# Patient Record
Sex: Male | Born: 2000 | Race: Black or African American | Hispanic: No | Marital: Single | Smoking: Never smoker
Health system: Southern US, Community
[De-identification: ages and names within clinical notes are randomized; demographics above are authoritative.]

## PROBLEM LIST (undated history)

## (undated) DIAGNOSIS — J45909 Unspecified asthma, uncomplicated: Secondary | ICD-10-CM

---
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
## 2019-07-15 DIAGNOSIS — Z419 Encounter for procedure for purposes other than remedying health state, unspecified: Secondary | ICD-10-CM | POA: Diagnosis not present

## 2019-08-15 DIAGNOSIS — Z419 Encounter for procedure for purposes other than remedying health state, unspecified: Secondary | ICD-10-CM | POA: Diagnosis not present

## 2019-09-15 DIAGNOSIS — Z419 Encounter for procedure for purposes other than remedying health state, unspecified: Secondary | ICD-10-CM | POA: Diagnosis not present

## 2019-10-15 DIAGNOSIS — Z419 Encounter for procedure for purposes other than remedying health state, unspecified: Secondary | ICD-10-CM | POA: Diagnosis not present

## 2019-11-15 DIAGNOSIS — Z419 Encounter for procedure for purposes other than remedying health state, unspecified: Secondary | ICD-10-CM | POA: Diagnosis not present

## 2019-12-15 DIAGNOSIS — Z419 Encounter for procedure for purposes other than remedying health state, unspecified: Secondary | ICD-10-CM | POA: Diagnosis not present

## 2020-01-15 DIAGNOSIS — Z419 Encounter for procedure for purposes other than remedying health state, unspecified: Secondary | ICD-10-CM | POA: Diagnosis not present

## 2020-02-15 DIAGNOSIS — Z419 Encounter for procedure for purposes other than remedying health state, unspecified: Secondary | ICD-10-CM | POA: Diagnosis not present

## 2020-03-14 DIAGNOSIS — Z419 Encounter for procedure for purposes other than remedying health state, unspecified: Secondary | ICD-10-CM | POA: Diagnosis not present

## 2020-04-14 DIAGNOSIS — Z419 Encounter for procedure for purposes other than remedying health state, unspecified: Secondary | ICD-10-CM | POA: Diagnosis not present

## 2020-05-14 DIAGNOSIS — Z419 Encounter for procedure for purposes other than remedying health state, unspecified: Secondary | ICD-10-CM | POA: Diagnosis not present

## 2020-06-14 DIAGNOSIS — Z419 Encounter for procedure for purposes other than remedying health state, unspecified: Secondary | ICD-10-CM | POA: Diagnosis not present

## 2020-07-14 DIAGNOSIS — Z419 Encounter for procedure for purposes other than remedying health state, unspecified: Secondary | ICD-10-CM | POA: Diagnosis not present

## 2020-08-14 DIAGNOSIS — Z419 Encounter for procedure for purposes other than remedying health state, unspecified: Secondary | ICD-10-CM | POA: Diagnosis not present

## 2020-09-14 DIAGNOSIS — Z419 Encounter for procedure for purposes other than remedying health state, unspecified: Secondary | ICD-10-CM | POA: Diagnosis not present

## 2020-10-14 DIAGNOSIS — Z419 Encounter for procedure for purposes other than remedying health state, unspecified: Secondary | ICD-10-CM | POA: Diagnosis not present

## 2020-11-14 DIAGNOSIS — Z419 Encounter for procedure for purposes other than remedying health state, unspecified: Secondary | ICD-10-CM | POA: Diagnosis not present

## 2020-12-14 DIAGNOSIS — Z419 Encounter for procedure for purposes other than remedying health state, unspecified: Secondary | ICD-10-CM | POA: Diagnosis not present

## 2021-01-14 DIAGNOSIS — Z419 Encounter for procedure for purposes other than remedying health state, unspecified: Secondary | ICD-10-CM | POA: Diagnosis not present

## 2021-02-14 DIAGNOSIS — Z419 Encounter for procedure for purposes other than remedying health state, unspecified: Secondary | ICD-10-CM | POA: Diagnosis not present

## 2021-03-14 DIAGNOSIS — Z419 Encounter for procedure for purposes other than remedying health state, unspecified: Secondary | ICD-10-CM | POA: Diagnosis not present

## 2021-04-14 DIAGNOSIS — Z419 Encounter for procedure for purposes other than remedying health state, unspecified: Secondary | ICD-10-CM | POA: Diagnosis not present

## 2021-05-14 DIAGNOSIS — Z419 Encounter for procedure for purposes other than remedying health state, unspecified: Secondary | ICD-10-CM | POA: Diagnosis not present

## 2021-06-14 DIAGNOSIS — Z419 Encounter for procedure for purposes other than remedying health state, unspecified: Secondary | ICD-10-CM | POA: Diagnosis not present

## 2021-07-14 DIAGNOSIS — Z419 Encounter for procedure for purposes other than remedying health state, unspecified: Secondary | ICD-10-CM | POA: Diagnosis not present

## 2021-07-25 ENCOUNTER — Telehealth: Payer: Self-pay

## 2021-07-25 NOTE — Telephone Encounter (Signed)
Attempted to call pt to assist making appt with PCP, unable to LM.

## 2021-08-14 DIAGNOSIS — Z419 Encounter for procedure for purposes other than remedying health state, unspecified: Secondary | ICD-10-CM | POA: Diagnosis not present

## 2021-09-14 DIAGNOSIS — Z419 Encounter for procedure for purposes other than remedying health state, unspecified: Secondary | ICD-10-CM | POA: Diagnosis not present

## 2021-10-14 DIAGNOSIS — Z419 Encounter for procedure for purposes other than remedying health state, unspecified: Secondary | ICD-10-CM | POA: Diagnosis not present

## 2021-11-14 DIAGNOSIS — Z419 Encounter for procedure for purposes other than remedying health state, unspecified: Secondary | ICD-10-CM | POA: Diagnosis not present

## 2021-12-14 DIAGNOSIS — Z419 Encounter for procedure for purposes other than remedying health state, unspecified: Secondary | ICD-10-CM | POA: Diagnosis not present

## 2022-01-14 DIAGNOSIS — Z419 Encounter for procedure for purposes other than remedying health state, unspecified: Secondary | ICD-10-CM | POA: Diagnosis not present

## 2022-02-14 DIAGNOSIS — Z419 Encounter for procedure for purposes other than remedying health state, unspecified: Secondary | ICD-10-CM | POA: Diagnosis not present

## 2022-12-03 ENCOUNTER — Other Ambulatory Visit: Payer: Self-pay

## 2022-12-03 ENCOUNTER — Emergency Department: Payer: Medicaid Other

## 2022-12-03 ENCOUNTER — Inpatient Hospital Stay
Admission: EM | Admit: 2022-12-03 | Discharge: 2022-12-10 | DRG: 871 | Disposition: A | Discharge status: Home / Self Care | Payer: Medicaid Other | Attending: Student | Admitting: Student

## 2022-12-03 ENCOUNTER — Encounter: Payer: Self-pay | Admitting: Internal Medicine

## 2022-12-03 DIAGNOSIS — R197 Diarrhea, unspecified: Secondary | ICD-10-CM | POA: Diagnosis present

## 2022-12-03 DIAGNOSIS — E1165 Type 2 diabetes mellitus with hyperglycemia: Secondary | ICD-10-CM | POA: Diagnosis present

## 2022-12-03 DIAGNOSIS — N179 Acute kidney failure, unspecified: Secondary | ICD-10-CM | POA: Diagnosis present

## 2022-12-03 DIAGNOSIS — A419 Sepsis, unspecified organism: Secondary | ICD-10-CM | POA: Diagnosis present

## 2022-12-03 DIAGNOSIS — E0800 Diabetes mellitus due to underlying condition with hyperosmolarity without nonketotic hyperglycemic-hyperosmolar coma (NKHHC): Secondary | ICD-10-CM

## 2022-12-03 DIAGNOSIS — Z79899 Other long term (current) drug therapy: Secondary | ICD-10-CM

## 2022-12-03 DIAGNOSIS — Z7984 Long term (current) use of oral hypoglycemic drugs: Secondary | ICD-10-CM | POA: Diagnosis not present

## 2022-12-03 DIAGNOSIS — R7989 Other specified abnormal findings of blood chemistry: Secondary | ICD-10-CM | POA: Diagnosis present

## 2022-12-03 DIAGNOSIS — N200 Calculus of kidney: Secondary | ICD-10-CM | POA: Diagnosis present

## 2022-12-03 DIAGNOSIS — D509 Iron deficiency anemia, unspecified: Secondary | ICD-10-CM | POA: Diagnosis present

## 2022-12-03 DIAGNOSIS — Z6841 Body Mass Index (BMI) 40.0 and over, adult: Secondary | ICD-10-CM

## 2022-12-03 DIAGNOSIS — E876 Hypokalemia: Secondary | ICD-10-CM | POA: Diagnosis present

## 2022-12-03 DIAGNOSIS — N390 Urinary tract infection, site not specified: Secondary | ICD-10-CM

## 2022-12-03 DIAGNOSIS — N10 Acute tubulo-interstitial nephritis: Secondary | ICD-10-CM | POA: Diagnosis present

## 2022-12-03 DIAGNOSIS — R946 Abnormal results of thyroid function studies: Secondary | ICD-10-CM | POA: Diagnosis present

## 2022-12-03 DIAGNOSIS — Z833 Family history of diabetes mellitus: Secondary | ICD-10-CM

## 2022-12-03 DIAGNOSIS — E662 Morbid (severe) obesity with alveolar hypoventilation: Secondary | ICD-10-CM | POA: Diagnosis present

## 2022-12-03 DIAGNOSIS — R9431 Abnormal electrocardiogram [ECG] [EKG]: Secondary | ICD-10-CM | POA: Diagnosis not present

## 2022-12-03 DIAGNOSIS — E559 Vitamin D deficiency, unspecified: Secondary | ICD-10-CM | POA: Diagnosis present

## 2022-12-03 DIAGNOSIS — J9601 Acute respiratory failure with hypoxia: Secondary | ICD-10-CM | POA: Diagnosis present

## 2022-12-03 DIAGNOSIS — I10 Essential (primary) hypertension: Secondary | ICD-10-CM | POA: Diagnosis present

## 2022-12-03 DIAGNOSIS — R6520 Severe sepsis without septic shock: Secondary | ICD-10-CM | POA: Diagnosis present

## 2022-12-03 DIAGNOSIS — Z1152 Encounter for screening for COVID-19: Secondary | ICD-10-CM

## 2022-12-03 DIAGNOSIS — J45909 Unspecified asthma, uncomplicated: Secondary | ICD-10-CM | POA: Diagnosis present

## 2022-12-03 HISTORY — DX: Morbid (severe) obesity due to excess calories: E66.01

## 2022-12-03 HISTORY — DX: Unspecified asthma, uncomplicated: J45.909

## 2022-12-03 LAB — URINALYSIS, W/ REFLEX TO CULTURE (INFECTION SUSPECTED)
Bilirubin Urine: NEGATIVE
Glucose, UA: NEGATIVE mg/dL
Ketones, ur: NEGATIVE mg/dL
Nitrite: POSITIVE — AB
Protein, ur: 100 mg/dL — AB
Specific Gravity, Urine: 1.017 (ref 1.005–1.030)
WBC, UA: >50 WBC/hpf (ref 0–5)
pH: 5 (ref 5.0–8.0)

## 2022-12-03 LAB — CBC WITH DIFFERENTIAL/PLATELET
Abs Immature Granulocytes: 0.47 10*3/uL — ABNORMAL HIGH (ref 0.00–0.07)
Basophils Absolute: 0.1 10*3/uL (ref 0.0–0.1)
Basophils Relative: 0 %
Eosinophils Absolute: 0.1 10*3/uL (ref 0.0–0.5)
Eosinophils Relative: 0 %
HCT: 37.6 % — ABNORMAL LOW (ref 39.0–52.0)
Hemoglobin: 13.3 g/dL (ref 13.0–17.0)
Immature Granulocytes: 2 %
Lymphocytes Relative: 7 %
Lymphs Abs: 2.1 10*3/uL (ref 0.7–4.0)
MCH: 27.1 pg (ref 26.0–34.0)
MCHC: 35.4 g/dL (ref 30.0–36.0)
MCV: 76.7 fL — ABNORMAL LOW (ref 80.0–100.0)
Monocytes Absolute: 1.8 10*3/uL — ABNORMAL HIGH (ref 0.1–1.0)
Monocytes Relative: 6 %
Neutro Abs: 24.7 10*3/uL — ABNORMAL HIGH (ref 1.7–7.7)
Neutrophils Relative %: 85 %
Platelets: 239 10*3/uL (ref 150–400)
RBC: 4.9 MIL/uL (ref 4.22–5.81)
RDW: 12.6 % (ref 11.5–15.5)
Smear Review: NORMAL
WBC: 29.2 10*3/uL — ABNORMAL HIGH (ref 4.0–10.5)
nRBC: 0 % (ref 0.0–0.2)

## 2022-12-03 LAB — APTT: aPTT: 33 s (ref 24–36)

## 2022-12-03 LAB — COMPREHENSIVE METABOLIC PANEL
ALT: 55 U/L — ABNORMAL HIGH (ref 0–44)
AST: 25 U/L (ref 15–41)
Albumin: 3.3 g/dL — ABNORMAL LOW (ref 3.5–5.0)
Alkaline Phosphatase: 86 U/L (ref 38–126)
Anion gap: 11 (ref 5–15)
BUN: 12 mg/dL (ref 6–20)
CO2: 21 mmol/L — ABNORMAL LOW (ref 22–32)
Calcium: 8.4 mg/dL — ABNORMAL LOW (ref 8.9–10.3)
Chloride: 102 mmol/L (ref 98–111)
Creatinine, Ser: 1.14 mg/dL (ref 0.61–1.24)
GFR, Estimated: >60 mL/min (ref >60)
Glucose, Bld: 219 mg/dL — ABNORMAL HIGH (ref 70–99)
Potassium: 3.3 mmol/L — ABNORMAL LOW (ref 3.5–5.1)
Sodium: 134 mmol/L — ABNORMAL LOW (ref 135–145)
Total Bilirubin: 2.3 mg/dL — ABNORMAL HIGH (ref <1.2)
Total Protein: 7.3 g/dL (ref 6.5–8.1)

## 2022-12-03 LAB — PROTIME-INR
INR: 1.4 — ABNORMAL HIGH (ref 0.8–1.2)
Prothrombin Time: 17.2 s — ABNORMAL HIGH (ref 11.4–15.2)

## 2022-12-03 LAB — LACTIC ACID, PLASMA
Lactic Acid, Venous: 1.8 mmol/L (ref 0.5–1.9)
Lactic Acid, Venous: 1.6 mmol/L (ref 0.5–1.9)

## 2022-12-03 LAB — RESP PANEL BY RT-PCR (RSV, FLU A&B, COVID)  RVPGX2
Influenza A by PCR: NEGATIVE
Influenza B by PCR: NEGATIVE
Resp Syncytial Virus by PCR: NEGATIVE
SARS Coronavirus 2 by RT PCR: NEGATIVE

## 2022-12-03 LAB — PHOSPHORUS: Phosphorus: 2.2 mg/dL — ABNORMAL LOW (ref 2.5–4.6)

## 2022-12-03 LAB — MAGNESIUM: Magnesium: 1.4 mg/dL — ABNORMAL LOW (ref 1.7–2.4)

## 2022-12-03 MED ORDER — HEPARIN SODIUM (PORCINE) 5000 UNIT/ML IJ SOLN
5000.0000 [IU] | Freq: Three times a day (TID) | INTRAMUSCULAR | Status: DC
  Administered 2022-12-03 – 2022-12-06 (×8): 5000 [IU] via SUBCUTANEOUS
  Filled 2022-12-03 (×8): qty 1

## 2022-12-03 MED ORDER — DM-GUAIFENESIN ER 30-600 MG PO TB12: 1.0000 | ORAL_TABLET | Freq: Two times a day (BID) | ORAL | Status: DC | PRN

## 2022-12-03 MED ORDER — LACTATED RINGERS IV SOLN: INTRAVENOUS | Status: AC

## 2022-12-03 MED ORDER — POTASSIUM CHLORIDE CRYS ER 20 MEQ PO TBCR
40.0000 meq | EXTENDED_RELEASE_TABLET | Freq: Once | ORAL | Status: AC
  Administered 2022-12-03: 40 meq via ORAL
  Filled 2022-12-03: qty 2

## 2022-12-03 MED ORDER — ALBUTEROL SULFATE (2.5 MG/3ML) 0.083% IN NEBU: 3.0000 mL | INHALATION_SOLUTION | RESPIRATORY_TRACT | Status: DC | PRN

## 2022-12-03 MED ORDER — LACTATED RINGERS IV BOLUS
1000.0000 mL | Freq: Once | INTRAVENOUS | Status: AC
  Administered 2022-12-03: 1000 mL via INTRAVENOUS

## 2022-12-03 MED ORDER — SODIUM CHLORIDE 0.9 % IV SOLN
2.0000 g | Freq: Once | INTRAVENOUS | Status: AC
  Administered 2022-12-03: 2 g via INTRAVENOUS
  Filled 2022-12-03: qty 12.5

## 2022-12-03 MED ORDER — IPRATROPIUM BROMIDE 0.02 % IN SOLN
2.5000 mL | Freq: Four times a day (QID) | RESPIRATORY_TRACT | Status: DC
  Administered 2022-12-03 – 2022-12-04 (×5): 0.5 mg via RESPIRATORY_TRACT
  Filled 2022-12-03 (×5): qty 2.5

## 2022-12-03 MED ORDER — ACETAMINOPHEN 500 MG PO TABS
1000.0000 mg | ORAL_TABLET | Freq: Once | ORAL | Status: AC
Start: 2022-12-03 — End: 2022-12-03
  Administered 2022-12-03: 1000 mg via ORAL
  Filled 2022-12-03: qty 2

## 2022-12-03 MED ORDER — IBUPROFEN 800 MG PO TABS
800.0000 mg | ORAL_TABLET | Freq: Once | ORAL | Status: AC
  Administered 2022-12-03: 800 mg via ORAL
  Filled 2022-12-03: qty 1

## 2022-12-03 MED ORDER — IBUPROFEN 400 MG PO TABS
600.0000 mg | ORAL_TABLET | ORAL | Status: DC | PRN
  Administered 2022-12-04: 600 mg via ORAL
  Filled 2022-12-03: qty 2

## 2022-12-03 MED ORDER — ONDANSETRON HCL 4 MG/2ML IJ SOLN
4.0000 mg | Freq: Three times a day (TID) | INTRAMUSCULAR | Status: DC | PRN
  Administered 2022-12-04 – 2022-12-05 (×2): 4 mg via INTRAVENOUS
  Filled 2022-12-03 (×2): qty 2

## 2022-12-03 MED ORDER — SODIUM CHLORIDE 0.9 % IV SOLN
2.0000 g | INTRAVENOUS | Status: DC
  Administered 2022-12-03 – 2022-12-05 (×3): 2 g via INTRAVENOUS
  Filled 2022-12-03 (×4): qty 20

## 2022-12-03 MED ORDER — VANCOMYCIN HCL IN DEXTROSE 1-5 GM/200ML-% IV SOLN
1000.0000 mg | Freq: Once | INTRAVENOUS | Status: AC
Start: 2022-12-03 — End: 2022-12-03
  Administered 2022-12-03: 1000 mg via INTRAVENOUS
  Filled 2022-12-03: qty 200

## 2022-12-03 NOTE — ED Notes (Signed)
Pt given urinal at this time 

## 2022-12-03 NOTE — H&P (Addendum)
History and Physical    Lawrence Knight PIR:[id number] DOB: 29-Nov-2000 DOA: 12/03/2022  Referring MD/NP/PA:   PCP: Pcp, No   Patient coming from:  The patient is coming from home.     Chief Complaint: Dysuria, increased urinary frequency, right flank pain, fever, chills  HPI: Lawrence Knight is a 22 y.o. male with medical history significant of morbid obesity with BMI 71, asthma, who presents with dysuria, increased urinary frequency, right flank pain, fever and chills.  Patient states that his symptoms started yesterday, including right flank pain, dysuria, increased urinary frequency, fever and chills.  No burning on urination.  Patient has dry cough, denies SOB or chest pain.  He has nausea, no vomiting, diarrhea or abdominal pain.  Per report, patient had wheezing initially, which has resolved.  When I saw patient in ED, patient does not have wheezing on my examination.  Data reviewed independently and ED Course: pt was found to have WBC 29.2, potassium 3.3, GFR> 60, positive urinalysis (cloudy appearance, moderate amount leukocyte, positive nitrite, few bacteria, WBC> 50), negative PCR for COVID, flu and RSV, abnormal liver function (ALP 86, AST 25, ALT 55, total bilirubin 2.3), lactic acid 1.8.  Temperature while 3.1, blood pressure 175/95, 131/98, heart rate 150 --> 120-130s, RR 29, oxygen saturation 100% on room air.  Chest x-ray showed bronchitis change.  Patient is admitted to PCU as inpatient.   EKG: I have personally reviewed.  Sinus rhythm, QTc 415, mild T wave inversion in inferior leads and V4-V6.  CT-renal stone  Mild perinephric stranding around right kidney without hydronephrosis. This could be related to recently passed stone or pyelonephritis. Recommend clinical correlation.   Review of Systems:   General: has fevers, chills, no body weight gain, has fatigue HEENT: no blurry vision, hearing changes or sore throat Respiratory: no dyspnea, has dry coughing, no wheezing CV:  no chest pain, no palpitations GI: has nausea, no vomiting, abdominal pain, diarrhea, constipation GU: has dysuria, increased urinary frequency, no hematuria or  burning on urination, Ext: no leg edema Neuro: no unilateral weakness, numbness, or tingling, no vision change or hearing loss Skin: no rash, no skin tear. MSK: No muscle spasm, no deformity, no limitation of range of movement in spin Heme: No easy bruising.  Travel history: No recent long distant travel.   Allergy: No Known Allergies  Past Medical History:  Diagnosis Date   Asthma    Morbid obesity with BMI of 70 and over, adult [facility])     History reviewed. No pertinent surgical history.  Social History:  reports that he has never smoked. He has never used smokeless tobacco. He reports that he does not currently use alcohol. He reports that he does not use drugs.  Family History:  Family History  Problem Relation Age of Onset   Diabetes Mother      Prior to Admission medications   Not on File    Physical Exam: Vitals:   12/03/22 1830 12/03/22 1930 12/03/22 2000 12/03/22 2030  BP: (!) 175/95 (!) 131/98 136/68 (!) 143/76  Pulse: (!) 121 (!) 139 (!) 125 (!) 125  Resp: (!) 25 18 (!) 31 (!) 26  Temp: (!) 103.1 F (39.5 C)   99.5 F (37.5 C)  TempSrc:    Oral  SpO2: 98% 95% 96% 97%  Weight:      Height:       General: Not in acute distress HEENT:       Eyes: PERRL, EOMI, no jaundice  ENT: No discharge from the ears and nose, no pharynx injection, no tonsillar enlargement.        Neck: No JVD, no bruit, no mass felt. Heme: No neck lymph node enlargement. Cardiac: S1/S2, RRR, No murmurs, No gallops or rubs. Respiratory: No rales, wheezing, rhonchi or rubs. GI: Soft, nondistended, nontender, no rebound pain, no organomegaly, BS present. GU: has positive right CVA tenderness Ext: No pitting leg edema bilaterally. 1+DP/PT pulse bilaterally. Musculoskeletal: No joint deformities, No joint redness or warmth,  no limitation of ROM in spin. Skin: No rashes.  Neuro: Alert, oriented X3, cranial nerves II-XII grossly intact, moves all extremities normally. Psych: Patient is not psychotic, no suicidal or hemocidal ideation.  Labs on Admission: I have personally reviewed following labs and imaging studies  CBC: Recent Labs  Lab 12/03/22 1322  WBC 29.2*  NEUTROABS 24.7*  HGB 13.3  HCT 37.6*  MCV 76.7*  PLT 239   Basic Metabolic Panel: Recent Labs  Lab 12/03/22 1322  NA 134*  K 3.3*  CL 102  CO2 21*  GLUCOSE 219*  BUN 12  CREATININE 1.14  CALCIUM 8.4*  MG 1.4*  PHOS 2.2*   GFR: Estimated Creatinine Clearance: 201 mL/min (by C-G formula based on SCr of 1.14 mg/dL). Liver Function Tests: Recent Labs  Lab 12/03/22 1322  AST 25  ALT 55*  ALKPHOS 86  BILITOT 2.3*  PROT 7.3  ALBUMIN 3.3*   No results for input(s): "LIPASE", "AMYLASE" in the last 168 hours. No results for input(s): "AMMONIA" in the last 168 hours. Coagulation Profile: Recent Labs  Lab 12/03/22 2045  INR 1.4*   Cardiac Enzymes: No results for input(s): "CKTOTAL", "CKMB", "CKMBINDEX", "TROPONINI" in the last 168 hours. BNP (last 3 results) No results for input(s): "PROBNP" in the last 8760 hours. HbA1C: No results for input(s): "HGBA1C" in the last 72 hours. CBG: No results for input(s): "GLUCAP" in the last 168 hours. Lipid Profile: No results for input(s): "CHOL", "HDL", "LDLCALC", "TRIG", "CHOLHDL", "LDLDIRECT" in the last 72 hours. Thyroid Function Tests: No results for input(s): "TSH", "T4TOTAL", "FREET4", "T3FREE", "THYROIDAB" in the last 72 hours. Anemia Panel: No results for input(s): "VITAMINB12", "FOLATE", "FERRITIN", "TIBC", "IRON", "RETICCTPCT" in the last 72 hours. Urine analysis:    Component Value Date/Time   COLORURINE AMBER (A) 12/03/2022 1801   APPEARANCEUR CLOUDY (A) 12/03/2022 1801   LABSPEC 1.017 12/03/2022 1801   PHURINE 5.0 12/03/2022 1801   GLUCOSEU NEGATIVE 12/03/2022 1801    HGBUR MODERATE (A) 12/03/2022 1801   BILIRUBINUR NEGATIVE 12/03/2022 1801   KETONESUR NEGATIVE 12/03/2022 1801   PROTEINUR 100 (A) 12/03/2022 1801   NITRITE POSITIVE (A) 12/03/2022 1801   LEUKOCYTESUR MODERATE (A) 12/03/2022 1801   Sepsis Labs: @LABRCNTIP (procalcitonin:4,lacticidven:4) ) Recent Results (from the past 240 hour(s))  Resp panel by RT-PCR (RSV, Flu A&B, Covid) Anterior Nasal Swab     Status: None   Collection Time: 12/03/22  1:43 PM   Specimen: Anterior Nasal Swab  Result Value Ref Range Status   SARS Coronavirus 2 by RT PCR NEGATIVE NEGATIVE Final    Comment: (NOTE) SARS-CoV-2 target nucleic acids are NOT DETECTED.  The SARS-CoV-2 RNA is generally detectable in upper respiratory specimens during the acute phase of infection. The lowest concentration of SARS-CoV-2 viral copies this assay can detect is 138 copies/mL. A negative result does not preclude SARS-Cov-2 infection and should not be used as the sole basis for treatment or other patient management decisions. A negative result may occur with  improper  specimen collection/handling, submission of specimen other than nasopharyngeal swab, presence of viral mutation(s) within the areas targeted by this assay, and inadequate number of viral copies(<138 copies/mL). A negative result must be combined with clinical observations, patient history, and epidemiological information. The expected result is Negative.  Fact Sheet for Patients:  BloggerCourse.com  Fact Sheet for Healthcare Providers:  SeriousBroker.it  This test is no t yet approved or cleared by the [place] FDA and  has been authorized for detection and/or diagnosis of SARS-CoV-2 by FDA under an Emergency Use Authorization (EUA). This EUA will remain  in effect (meaning this test can be used) for the duration of the COVID-19 declaration under Section 564(b)(1) of the Act, 21 U.S.C.section  360bbb-3(b)(1), unless the authorization is terminated  or revoked sooner.       Influenza A by PCR NEGATIVE NEGATIVE Final   Influenza B by PCR NEGATIVE NEGATIVE Final    Comment: (NOTE) The Xpert Xpress SARS-CoV-2/FLU/RSV plus assay is intended as an aid in the diagnosis of influenza from Nasopharyngeal swab specimens and should not be used as a sole basis for treatment. Nasal washings and aspirates are unacceptable for Xpert Xpress SARS-CoV-2/FLU/RSV testing.  Fact Sheet for Patients: BloggerCourse.com  Fact Sheet for Healthcare Providers: SeriousBroker.it  This test is not yet approved or cleared by the [place] FDA and has been authorized for detection and/or diagnosis of SARS-CoV-2 by FDA under an Emergency Use Authorization (EUA). This EUA will remain in effect (meaning this test can be used) for the duration of the COVID-19 declaration under Section 564(b)(1) of the Act, 21 U.S.C. section 360bbb-3(b)(1), unless the authorization is terminated or revoked.     Resp Syncytial Virus by PCR NEGATIVE NEGATIVE Final    Comment: (NOTE) Fact Sheet for Patients: BloggerCourse.com  Fact Sheet for Healthcare Providers: SeriousBroker.it  This test is not yet approved or cleared by the [place] FDA and has been authorized for detection and/or diagnosis of SARS-CoV-2 by FDA under an Emergency Use Authorization (EUA). This EUA will remain in effect (meaning this test can be used) for the duration of the COVID-19 declaration under Section 564(b)(1) of the Act, 21 U.S.C. section 360bbb-3(b)(1), unless the authorization is terminated or revoked.  Performed at [facility], [address] [place], [place] [address]      Radiological Exams on Admission: CT Renal Stone Study  Result Date: 12/03/2022 CLINICAL DATA:  Right flank pain EXAM: CT ABDOMEN AND PELVIS  WITHOUT CONTRAST TECHNIQUE: Multidetector CT imaging of the abdomen and pelvis was performed following the standard protocol without IV contrast. RADIATION DOSE REDUCTION: This exam was performed according to the departmental dose-optimization program which includes automated exposure control, adjustment of the mA and/or kV according to patient size and/or use of iterative reconstruction technique. COMPARISON:  None Available. FINDINGS: Lower chest: No acute abnormality Hepatobiliary: Diffuse low-density throughout the liver compatible with fatty infiltration. No focal abnormality. Gallbladder not visualized and could be contracted or absent. Pancreas: No focal abnormality or ductal dilatation. Spleen: No focal abnormality.  Normal size. Adrenals/Urinary Tract: Adrenal glands normal. Perinephric stranding around the right kidney without visible stones or hydronephrosis. Left kidney unremarkable. Urinary bladder unremarkable. Stomach/Bowel: Stomach, large and small bowel grossly unremarkable. Appendix not definitively seen. Vascular/Lymphatic: No evidence of aneurysm or adenopathy. Reproductive: No visible focal abnormality. Other: No free fluid or free air. Musculoskeletal: No acute bony abnormality. IMPRESSION: Mild perinephric stranding around right kidney without hydronephrosis. This could be related to recently passed stone or pyelonephritis.  Recommend clinical correlation. Fatty liver. Electronically Signed   By: Charlett Nose M.D.   On: 12/03/2022 19:45   DG Chest Port 1 View  Result Date: 12/03/2022 CLINICAL DATA:  Questionable sepsis - evaluate for abnormality EXAM: PORTABLE CHEST 1 VIEW COMPARISON:  None Available. FINDINGS: Heart size upper normal. Mediastinal contours are normal. Suspected bronchial thickening without focal airspace disease. No large pleural effusion. No pneumothorax. Soft tissue attenuation from habitus limits assessment. IMPRESSION: Suspected bronchial thickening without focal  airspace disease. Electronically Signed   By: Narda Rutherford M.D.   On: 12/03/2022 17:45      Assessment/Plan Principal Problem:   Acute pyelonephritis Active Problems:   Sepsis (HCC)   Abnormal LFTs   Morbid obesity with BMI of 70 and over, adult (HCC)   Asthma   Assessment and Plan:  Sepsis due to acute pyelonephritis: Patient has sepsis with WBC 29.2, heart rate up to 150, RR up to 25-30s, temperature 102.3 0.1.  Lactic acid is normal, 1.8 --> 1.6.  CT per renal stone protocol is negative for hydronephrosis, but showed mild perinephric stranding around right kidney, could be related to recent passage of stone or pyelonephritis.  Patient is at high risk of developing hypotension.  -will admit to PCU as inpt -IV Rocephin (patient received 1 dose of vancomycin and cefepime in ED) -Follow-up of blood culture and urine culture -IV fluid: 3 L LR, 100 cc/h -As needed ibuprofen for fever and pain -As needed Zofran for nausea  Abnormal LFTs: Mild abnormal liver function, possibly due to sepsis -Check hepatitis panel -Avoid using Tylenol  Morbid obesity with BMI of 70 and over, adult [facility]): Body weight 239 kg, BMI 71.47 -Encouraged losing weight -Exercise and healthy diet  Asthma:  -Bronchodilators and as needed Mucinex      DVT ppx: SQ Heparin    Code Status: Full code     Family Communication: not done, no family member is at bed side.       Disposition Plan:  Anticipate discharge back to previous environment  Consults called:  none  Admission status and Level of care: Progressive:    as inpt       Dispo: The patient is from: Home              Anticipated d/c is to: Home              Anticipated d/c date is: 2 days              Patient currently is not medically stable to d/c.    Severity of Illness:  The appropriate patient status for this patient is INPATIENT. Inpatient status is judged to be reasonable and necessary in order to provide the required  intensity of service to ensure the patient's safety. The patient's presenting symptoms, physical exam findings, and initial radiographic and laboratory data in the context of their chronic comorbidities is felt to place them at high risk for further clinical deterioration. Furthermore, it is not anticipated that the patient will be medically stable for discharge from the hospital within 2 midnights of admission.   * I certify that at the point of admission it is my clinical judgment that the patient will require inpatient hospital care spanning beyond 2 midnights from the point of admission due to high intensity of service, high risk for further deterioration and high frequency of surveillance required.*       Date of Service 12/03/2022    Lorretta Harp [facility]  If 7PM-7AM, please contact night-coverage [email or web address] 12/03/2022, 11:37 PM

## 2022-12-03 NOTE — ED Notes (Signed)
This RN notified MD of patient's temperature.

## 2022-12-03 NOTE — ED Notes (Signed)
Called CCMD for cardiac monitoring

## 2022-12-03 NOTE — ED Provider Notes (Signed)
[facility] Provider Note    Event Date/Time   First MD Initiated Contact with Patient 12/03/22 1322     (approximate)   History   Fever, Dysuria, and Flank Pain   HPI  Lawrence Knight is a 22 y.o. male who presents to the emergency department today because of concerns for right flank pain.  The patient states that the pain started around 10:00 yesterday evening.  He had been having a headache earlier in the day.  This morning he noticed painful urination and bad odor to his urine.  He also started developing fevers.  The patient denies similar symptoms in the past.  Denies history of urinary tract infections.     Physical Exam   Triage Vital Signs: ED Triage Vitals  Encounter Vitals Group     BP 12/03/22 1314 (!) 121/106     Systolic BP Percentile --      Diastolic BP Percentile --      Pulse Rate 12/03/22 1314 (!) 145     Resp 12/03/22 1314 20     Temp 12/03/22 1314 (!) 103.1 F (39.5 C)     Temp Source 12/03/22 1314 Oral     SpO2 12/03/22 1314 95 %     Weight 12/03/22 1315 (!) 527 lb (239 kg)     Height 12/03/22 1315 6' (1.829 m)     Head Circumference --      Peak Flow --      Pain Score 12/03/22 1315 4     Pain Loc --      Pain Education --      Exclude from Growth Chart --     Most recent vital signs: Vitals:   12/03/22 1314 12/03/22 1315  BP: (!) 121/106 (!) 152/100  Pulse: (!) 145 (!) 150  Resp: 20 (!) 21  Temp: (!) 103.1 F (39.5 C)   SpO2: 95% 99%   General: Awake, alert, oriented. CV:  Good peripheral perfusion. Tachycardia. Resp:  Normal effort. Tachypnea Abd:  No distention. Non tender.   ED Results / Procedures / Treatments   Labs (all labs ordered are listed, but only abnormal results are displayed) Labs Reviewed  COMPREHENSIVE METABOLIC PANEL - Abnormal; Notable for the following components:      Result Value   Sodium 134 (*)    Potassium 3.3 (*)    CO2 21 (*)    Glucose, Bld 219 (*)    Calcium 8.4 (*)     Albumin 3.3 (*)    ALT 55 (*)    Total Bilirubin 2.3 (*)    All other components within normal limits  CBC WITH DIFFERENTIAL/PLATELET - Abnormal; Notable for the following components:   WBC 29.2 (*)    HCT 37.6 (*)    MCV 76.7 (*)    Neutro Abs 24.7 (*)    Monocytes Absolute 1.8 (*)    Abs Immature Granulocytes 0.47 (*)    All other components within normal limits  CULTURE, BLOOD (ROUTINE X 2)  CULTURE, BLOOD (ROUTINE X 2)  RESP PANEL BY RT-PCR (RSV, FLU A&B, COVID)  [id number]  LACTIC ACID, PLASMA  LACTIC ACID, PLASMA  URINALYSIS, W/ REFLEX TO CULTURE (INFECTION SUSPECTED)     EKG  I, Phineas Semen, attending physician, personally viewed and interpreted this EKG  EKG Time: 1341 Rate: 130 Rhythm: sinus tachycardia Axis: normal Intervals: qtc 415 QRS: narrow ST changes: no st elevation Impression: abnormal ekg   RADIOLOGY I independently interpreted and visualized  the CXR. My interpretation: No pneumonia Radiology interpretation: pending at time of sign out  I independently interpreted and visualized the CT renal. My interpretation: No stones, right sided pyelonephritis. Radiology interpretation: pending at time of sign out     PROCEDURES:  Critical Care performed: Yes  CRITICAL CARE Performed by: Phineas Semen   Total critical care time: 30 minutes  Critical care time was exclusive of separately billable procedures and treating other patients.  Critical care was necessary to treat or prevent imminent or life-threatening deterioration.  Critical care was time spent personally by me on the following activities: development of treatment plan with patient and/or surrogate as well as nursing, discussions with consultants, evaluation of patient's response to treatment, examination of patient, obtaining history from patient or surrogate, ordering and performing treatments and interventions, ordering and review of laboratory studies, ordering and review of  radiographic studies, pulse oximetry and re-evaluation of patient's condition.   MEDICATIONS ORDERED IN ED: Medications - No data to display   IMPRESSION / MDM / ASSESSMENT AND PLAN / ED COURSE  I reviewed the triage vital signs and the nursing notes.                              Differential diagnosis includes, but is not limited to, UTI, pyelonephritis, kidney stone  Patient's presentation is most consistent with acute presentation with potential threat to life or bodily function.   The patient is on the cardiac monitor to evaluate for evidence of arrhythmia and/or significant heart rate changes.  Patient presented to the emergency department today because of concern for right sided flank pain. Found to have fever, tachycardia. Blood work concerning for leukocytosis. Patient was started on antibiotics and fluids. Will get CT scan to evaluate for possible stone/pyelo.  CT scan per my read is consistent with pyelonephritis. I do not see any obvious obstructing stones. Awaiting official radiology read and UA at time of sign out.      FINAL CLINICAL IMPRESSION(S) / ED DIAGNOSES   Pyelonephritis  Note:  This document was prepared using Dragon voice recognition software and may include unintentional dictation errors.    Phineas Semen, MD 12/04/22 [phone]

## 2022-12-03 NOTE — ED Triage Notes (Signed)
Pt arrives via ems from home, pt c/o right flank pain, pt having pain with urination, states chills and nausea last pm, states darker than normal with a strong odor

## 2022-12-03 NOTE — ED Notes (Signed)
EKG given to MD at this time.

## 2022-12-03 NOTE — ED Provider Notes (Signed)
-----------------------------------------   6:42 PM on 12/03/2022 ----------------------------------------- Patient care assumed from Dr. Derrill Kay.  Patient's urinalysis has resulted nitrite positive with white blood cells bacteria and white blood cell clumps likely the source of the patient's infection.  Patient is febrile once again we will dose ibuprofen.  Continue with IV antibiotics and admit to the hospital service as the patient meets sepsis criteria.  I have reviewed the CT images on my evaluation I do not see any obvious kidney stone or other significant abnormality.   Minna Antis, MD 12/03/22 [phone]

## 2022-12-04 ENCOUNTER — Inpatient Hospital Stay: Payer: Medicaid Other

## 2022-12-04 ENCOUNTER — Encounter: Payer: Self-pay | Admitting: Internal Medicine

## 2022-12-04 DIAGNOSIS — N10 Acute tubulo-interstitial nephritis: Secondary | ICD-10-CM | POA: Diagnosis not present

## 2022-12-04 LAB — BLOOD CULTURE ID PANEL (REFLEXED) - BCID2

## 2022-12-04 LAB — CBC
HCT: 31.9 % — ABNORMAL LOW (ref 39.0–52.0)
Hemoglobin: 11.4 g/dL — ABNORMAL LOW (ref 13.0–17.0)
MCH: 27.5 pg (ref 26.0–34.0)
MCHC: 35.7 g/dL (ref 30.0–36.0)
MCV: 76.9 fL — ABNORMAL LOW (ref 80.0–100.0)
Platelets: 180 10*3/uL (ref 150–400)
RBC: 4.15 MIL/uL — ABNORMAL LOW (ref 4.22–5.81)
RDW: 12.8 % (ref 11.5–15.5)
WBC: 18.6 10*3/uL — ABNORMAL HIGH (ref 4.0–10.5)
nRBC: 0 % (ref 0.0–0.2)

## 2022-12-04 LAB — LIPID PANEL
Cholesterol: 112 mg/dL (ref 0–200)
HDL: 17 mg/dL — ABNORMAL LOW (ref >40)
LDL Cholesterol: 73 mg/dL (ref 0–99)
Total CHOL/HDL Ratio: 6.6 {ratio}
Triglycerides: 109 mg/dL (ref <150)
VLDL: 22 mg/dL (ref 0–40)

## 2022-12-04 LAB — COMPREHENSIVE METABOLIC PANEL
ALT: 44 U/L (ref 0–44)
AST: 24 U/L (ref 15–41)
Albumin: 2.7 g/dL — ABNORMAL LOW (ref 3.5–5.0)
Alkaline Phosphatase: 77 U/L (ref 38–126)
Anion gap: 11 (ref 5–15)
BUN: 18 mg/dL (ref 6–20)
CO2: 20 mmol/L — ABNORMAL LOW (ref 22–32)
Calcium: 8.1 mg/dL — ABNORMAL LOW (ref 8.9–10.3)
Chloride: 105 mmol/L (ref 98–111)
Creatinine, Ser: 1.68 mg/dL — ABNORMAL HIGH (ref 0.61–1.24)
GFR, Estimated: 58 mL/min — ABNORMAL LOW (ref >60)
Glucose, Bld: 241 mg/dL — ABNORMAL HIGH (ref 70–99)
Potassium: 3.4 mmol/L — ABNORMAL LOW (ref 3.5–5.1)
Sodium: 136 mmol/L (ref 135–145)
Total Bilirubin: 2.1 mg/dL — ABNORMAL HIGH (ref <1.2)
Total Protein: 6.3 g/dL — ABNORMAL LOW (ref 6.5–8.1)

## 2022-12-04 LAB — GLUCOSE, CAPILLARY
Glucose-Capillary: 207 mg/dL — ABNORMAL HIGH (ref 70–99)
Glucose-Capillary: 234 mg/dL — ABNORMAL HIGH (ref 70–99)

## 2022-12-04 LAB — VITAMIN B12: Vitamin B-12: 218 pg/mL (ref 180–914)

## 2022-12-04 LAB — CBG MONITORING, ED
Glucose-Capillary: 213 mg/dL — ABNORMAL HIGH (ref 70–99)
Glucose-Capillary: 205 mg/dL — ABNORMAL HIGH (ref 70–99)

## 2022-12-04 LAB — IRON AND TIBC
Iron: 16 ug/dL — ABNORMAL LOW (ref 45–182)
Saturation Ratios: 8 % — ABNORMAL LOW (ref 17.9–39.5)
TIBC: 209 ug/dL — ABNORMAL LOW (ref 250–450)
UIBC: 193 ug/dL

## 2022-12-04 LAB — D-DIMER, QUANTITATIVE: D-Dimer, Quant: 3.61 ug{FEU}/mL — ABNORMAL HIGH (ref 0.00–0.50)

## 2022-12-04 LAB — HEPATITIS PANEL, ACUTE
HCV Ab: NONREACTIVE
Hep A IgM: NONREACTIVE
Hep B C IgM: NONREACTIVE
Hepatitis B Surface Ag: NONREACTIVE

## 2022-12-04 LAB — T4, FREE: Free T4: 1.16 ng/dL — ABNORMAL HIGH (ref 0.61–1.12)

## 2022-12-04 LAB — BASIC METABOLIC PANEL
Anion gap: 9 (ref 5–15)
BUN: 19 mg/dL (ref 6–20)
CO2: 22 mmol/L (ref 22–32)
Calcium: 8 mg/dL — ABNORMAL LOW (ref 8.9–10.3)
Chloride: 101 mmol/L (ref 98–111)
Creatinine, Ser: 1.39 mg/dL — ABNORMAL HIGH (ref 0.61–1.24)
GFR, Estimated: >60 mL/min (ref >60)
Glucose, Bld: 232 mg/dL — ABNORMAL HIGH (ref 70–99)
Potassium: 3.7 mmol/L (ref 3.5–5.1)
Sodium: 132 mmol/L — ABNORMAL LOW (ref 135–145)

## 2022-12-04 LAB — HIV ANTIBODY (ROUTINE TESTING W REFLEX): HIV Screen 4th Generation wRfx: NONREACTIVE

## 2022-12-04 LAB — PHOSPHORUS: Phosphorus: 1.2 mg/dL — ABNORMAL LOW (ref 2.5–4.6)

## 2022-12-04 LAB — VITAMIN D 25 HYDROXY (VIT D DEFICIENCY, FRACTURES): Vit D, 25-Hydroxy: 8.27 ng/mL — ABNORMAL LOW (ref 30–100)

## 2022-12-04 LAB — FOLATE: Folate: 9.1 ng/mL (ref >5.9)

## 2022-12-04 LAB — MAGNESIUM: Magnesium: 1.5 mg/dL — ABNORMAL LOW (ref 1.7–2.4)

## 2022-12-04 LAB — TSH: TSH: 1.377 u[IU]/mL (ref 0.350–4.500)

## 2022-12-04 MED ORDER — MAGNESIUM SULFATE 4 GM/100ML IV SOLN
4.0000 g | Freq: Once | INTRAVENOUS | Status: AC
  Administered 2022-12-04: 4 g via INTRAVENOUS
  Filled 2022-12-04: qty 100

## 2022-12-04 MED ORDER — CYANOCOBALAMIN 1000 MCG/ML IJ SOLN
1000.0000 ug | Freq: Every day | INTRAMUSCULAR | Status: AC
  Administered 2022-12-04 – 2022-12-10 (×7): 1000 ug via INTRAMUSCULAR
  Filled 2022-12-04 (×7): qty 1

## 2022-12-04 MED ORDER — ACETAMINOPHEN 325 MG PO TABS
650.0000 mg | ORAL_TABLET | Freq: Four times a day (QID) | ORAL | Status: DC | PRN
  Administered 2022-12-04 – 2022-12-06 (×5): 650 mg via ORAL
  Filled 2022-12-04 (×5): qty 2

## 2022-12-04 MED ORDER — VITAMIN B-12 1000 MCG PO TABS: 1000.0000 ug | ORAL_TABLET | Freq: Every day | ORAL | Status: DC

## 2022-12-04 MED ORDER — POTASSIUM PHOSPHATES 15 MMOLE/5ML IV SOLN
30.0000 mmol | Freq: Once | INTRAVENOUS | Status: AC
  Administered 2022-12-04: 30 mmol via INTRAVENOUS
  Filled 2022-12-04: qty 10

## 2022-12-04 MED ORDER — HYDROMORPHONE HCL 2 MG PO TABS: 2.0000 mg | ORAL_TABLET | Freq: Four times a day (QID) | ORAL | Status: DC | PRN

## 2022-12-04 MED ORDER — INSULIN ASPART 100 UNIT/ML IJ SOLN
0.0000 [IU] | Freq: Three times a day (TID) | INTRAMUSCULAR | Status: DC
  Administered 2022-12-04 – 2022-12-05 (×4): 7 [IU] via SUBCUTANEOUS
  Administered 2022-12-05: 4 [IU] via SUBCUTANEOUS
  Administered 2022-12-05: 7 [IU] via SUBCUTANEOUS
  Administered 2022-12-06: 11 [IU] via SUBCUTANEOUS
  Administered 2022-12-06: 7 [IU] via SUBCUTANEOUS
  Administered 2022-12-06: 4 [IU] via SUBCUTANEOUS
  Administered 2022-12-07: 7 [IU] via SUBCUTANEOUS
  Administered 2022-12-07: 4 [IU] via SUBCUTANEOUS
  Administered 2022-12-07 – 2022-12-08 (×2): 7 [IU] via SUBCUTANEOUS
  Administered 2022-12-08: 4 [IU] via SUBCUTANEOUS
  Administered 2022-12-08: 7 [IU] via SUBCUTANEOUS
  Administered 2022-12-09: 3 [IU] via SUBCUTANEOUS
  Administered 2022-12-09 – 2022-12-10 (×4): 4 [IU] via SUBCUTANEOUS
  Filled 2022-12-04 (×20): qty 1

## 2022-12-04 MED ORDER — ACETAMINOPHEN 500 MG PO TABS
1000.0000 mg | ORAL_TABLET | Freq: Once | ORAL | Status: AC
  Administered 2022-12-04: 1000 mg via ORAL
  Filled 2022-12-04: qty 2

## 2022-12-04 MED ORDER — VITAMIN D (ERGOCALCIFEROL) 1.25 MG (50000 UNIT) PO CAPS
50000.0000 [IU] | ORAL_CAPSULE | ORAL | Status: DC
Start: 2022-12-04 — End: 2022-12-10
  Administered 2022-12-04: 50000 [IU] via ORAL
  Filled 2022-12-04: qty 1

## 2022-12-04 MED ORDER — METOPROLOL TARTRATE 5 MG/5ML IV SOLN
5.0000 mg | Freq: Once | INTRAVENOUS | Status: AC
  Administered 2022-12-04: 5 mg via INTRAVENOUS
  Filled 2022-12-04: qty 5

## 2022-12-04 MED ORDER — IOHEXOL 350 MG/ML SOLN
100.0000 mL | Freq: Once | INTRAVENOUS | Status: AC | PRN
  Administered 2022-12-04: 100 mL via INTRAVENOUS

## 2022-12-04 MED ORDER — POLYSACCHARIDE IRON COMPLEX 150 MG PO CAPS
150.0000 mg | ORAL_CAPSULE | Freq: Every day | ORAL | Status: DC
  Administered 2022-12-04 – 2022-12-10 (×7): 150 mg via ORAL
  Filled 2022-12-04 (×7): qty 1

## 2022-12-04 MED ORDER — METOPROLOL TARTRATE 50 MG PO TABS
50.0000 mg | ORAL_TABLET | Freq: Two times a day (BID) | ORAL | Status: DC
  Administered 2022-12-04 – 2022-12-05 (×3): 50 mg via ORAL
  Filled 2022-12-04 (×4): qty 1

## 2022-12-04 MED ORDER — HYDROMORPHONE HCL 2 MG PO TABS
2.0000 mg | ORAL_TABLET | Freq: Four times a day (QID) | ORAL | Status: DC | PRN
  Administered 2022-12-04: 2 mg via ORAL
  Filled 2022-12-04: qty 1

## 2022-12-04 MED ORDER — LACTATED RINGERS IV BOLUS
500.0000 mL | Freq: Once | INTRAVENOUS | Status: AC
  Administered 2022-12-04: 500 mL via INTRAVENOUS

## 2022-12-04 MED ORDER — IBUPROFEN 400 MG PO TABS: 600.0000 mg | ORAL_TABLET | Freq: Three times a day (TID) | ORAL | Status: DC | PRN

## 2022-12-04 NOTE — Consult Note (Signed)
PHARMACY CONSULT NOTE - ELECTROLYTES  Pharmacy Consult for Electrolyte Monitoring and Replacement   Recent Labs: Height: 6' (182.9 cm) Weight: (!) 239 kg (527 lb) IBW/kg (Calculated) : 77.6 Estimated Creatinine Clearance: 136.4 mL/min (A) (by C-G formula based on SCr of 1.68 mg/dL (H)). Potassium (mmol/L)  Date Value  12/04/2022 3.4 (L)   Magnesium (mg/dL)  Date Value  16/10/9602 1.5 (L)   Calcium (mg/dL)  Date Value  54/09/8117 8.1 (L)   Albumin (g/dL)  Date Value  14/78/2956 2.7 (L)   Phosphorus (mg/dL)  Date Value  21/30/8657 1.2 (L)   Sodium (mmol/L)  Date Value  12/04/2022 136    Assessment  Lawrence Knight is a 22 y.o. male presenting with right flank pain and dysuria. PMH significant for morbid obesity, asthma. Pharmacy has been consulted to monitor and replace electrolytes.  Diet: Low carbohydrate MIVF: LR @ 100 mL/hr Pertinent medications: N/A  Goal of Therapy: Electrolytes WNL  Plan:  K 3.4 >> will receive 22 mEq of K from the potassium phosphate Mg 1.5 >> magnesium sulfate 4 g IV x 1 ordered by primary team Phos 1.2 >> potassium phosphate 30 mmol x 1 ordered by primary team Check BMP, Mg, Phos with AM labs  Thank you for allowing pharmacy to be a part of this patient's care.  Orson Aloe, PharmD Clinical Pharmacist 12/04/2022 3:42 PM

## 2022-12-04 NOTE — Progress Notes (Signed)
[facility] Progress Note  Patient: Lawrence Knight    WJX:[id number]  DOA: 12/03/2022     Date of Service: the patient was seen and examined on 12/04/2022  Chief Complaint  Patient presents with   Fever   Dysuria   Flank Pain   Brief hospital course: Elijuah Yeck is a 22 y.o. male with medical history significant of morbid obesity with BMI 71, asthma, who presents with dysuria, increased urinary frequency, right flank pain, fever and chills.   Patient states that his symptoms started yesterday, including right flank pain, dysuria, increased urinary frequency, fever and chills.  No burning on urination.  Patient has dry cough, denies SOB or chest pain.  He has nausea, no vomiting, diarrhea or abdominal pain.  Per report, patient had wheezing initially, which has resolved.  When I saw patient in ED, patient does not have wheezing on my examination.  ED Course: Leukocytosis WBC 29.2, potassium 3.3, GFR> 60, positive urinalysis (cloudy appearance, moderate amount leukocyte, positive nitrite, few bacteria, WBC> 50), negative PCR for COVID, flu and RSV, abnormal liver function (ALP 86, AST 25, ALT 55, total bilirubin 2.3), lactic acid 1.8.  Temperature while 3.1, blood pressure 175/95, 131/98, heart rate 150 --> 120-130s, RR 29, oxygen saturation 100% on room air.  Chest x-ray showed bronchitis change.  EKG: I have personally reviewed.  Sinus rhythm, QTc 415, mild T wave inversion in inferior leads and V4-V6.  CT-renal stone  Mild perinephric stranding around right kidney without hydronephrosis. This could be related to recently passed stone or pyelonephritis. Recommend clinical correlation.   Assessment and Plan:  Sepsis due to acute right pyelonephritis: Patient has sepsis with WBC 29.2, heart rate up to 150, RR up to 25-30s, temperature 102.3 0.1.  Lactic acid is normal, 1.8 --> 1.6.   CT per renal stone protocol is negative for hydronephrosis, but showed mild perinephric stranding  around right kidney, could be related to recent passage of stone or pyelonephritis.   Patient is at high risk of developing hypotension. Abnormal LFTs: Mild abnormal liver function, possibly due to sepsis. hepatitis panel negative -IV Rocephin (patient received 1 dose of vancomycin and cefepime in ED) -Follow-up of blood culture, growing E. coli, follow sensitivity report urine culture -IV fluid: 3 L LR, 900 cc/h -Continue Tylenol as needed -As needed Zofran for nausea     Acute hypoxic respiratory failure Continue supplemental O2 in addition Check D-dimer, if elevated patient may need CTA to rule out PE  Asthma:  -Bronchodilators and as needed Mucinex  AKI due to sepsis Creatinine 1.68 Continue IV fluid for hydration Avoid nephrotoxic medications, use renally dose medications Monitor renal functions and urine output  Hypokalemia, potassium repleted. Hypophosphatemia, Phos repleted. Hypomagnesemia, mag repleted. Monitor electrolytes and replete as needed Pharmacy consulted for electrolyte monitoring and replacement  Iron deficiency anemia, transferrin saturation 8% Avoided IV iron due to active infection, started oral iron supplement Follow with PCP to repeat iron profile after 3 to 6 months  Vitamin D deficiency: started vitamin D 50,000 units p.o. weekly, follow with PCP to repeat vitamin D level after 3 to 6 months.  Vitamin B12 level 218, goal >400, started vitamin B12 1000 mcg IM injection during hospital stay followed by oral supplement.  Follow with PCP to repeat B12 level after 3 to 6 months.  Hyperglycemia, no history of diabetes. Family history of diabetes on mother side Started NovoLog sliding scale CBG, continue diabetic diet Check hemoglobin A1c   Morbid obesity with BMI  of 70 and over, adult [facility]): Body weight 239 kg, BMI 71.47 -Encouraged losing weight -Exercise and healthy diet Body mass index is 71.47 kg/m.  Interventions:  Diet: Diabetic diet DVT  Prophylaxis: Subcutaneous Heparin    Advance goals of care discussion: Full code  Family Communication: family was not present at bedside, at the time of interview.  The pt provided permission to discuss medical plan with the family. Opportunity was given to ask question and all questions were answered satisfactorily.   Disposition:  Pt is from Home, admitted with sepsis due to pyelo, still on Iv Abx, which precludes a safe discharge. Discharge to home, when may need few days to improve.  Subjective: No significant events overnight, patient denies any abdominal pain at this time, no chest pain or palpitation, mild shortness of breath.  No any other active issues.  Physical Exam: General: NAD, lying comfortably Appear in no distress, affect appropriate Eyes: PERRLA ENT: Oral Mucosa Clear, moist  Neck: no JVD,  Cardiovascular: S1 and S2 Present, no Murmur,  Respiratory: good respiratory effort, Bilateral Air entry equal and Decreased, no Crackles, no wheezes Abdomen: Bowel Sound present, Soft, obese and no tenderness,  Skin: no rashes Extremities: no Pedal edema, no calf tenderness Neurologic: without any new focal findings Gait not checked due to patient safety concerns  Vitals:   12/04/22 1200 12/04/22 1230 12/04/22 1300 12/04/22 1500  BP: (!) 151/99 111/89 (!) 136/113 (!) 168/110  Pulse: (!) 108 (!) 108 (!) 131 (!) 128  Resp:   20 (!) 22  Temp:    100.3 F (37.9 C)  TempSrc:    Oral  SpO2: 95% 96% 96% 97%  Weight:      Height:        Intake/Output Summary (Last 24 hours) at 12/04/2022 1532 Last data filed at 12/04/2022 1309 Gross per 24 hour  Intake 4644.5 ml  Output 400 ml  Net 4244.5 ml   Filed Weights   12/03/22 1315  Weight: (!) 239 kg    Data Reviewed: I have personally reviewed and interpreted daily labs, tele strips, imagings as discussed above. I reviewed all nursing notes, pharmacy notes, vitals, pertinent old records I have discussed plan of care as  described above with RN and patient/family.  CBC: Recent Labs  Lab 12/03/22 1322 12/04/22 0630  WBC 29.2* 18.6*  NEUTROABS 24.7*  --   HGB 13.3 11.4*  HCT 37.6* 31.9*  MCV 76.7* 76.9*  PLT 239 180   Basic Metabolic Panel: Recent Labs  Lab 12/03/22 1322 12/04/22 0630  NA 134* 136  K 3.3* 3.4*  CL 102 105  CO2 21* 20*  GLUCOSE 219* 241*  BUN 12 18  CREATININE 1.14 1.68*  CALCIUM 8.4* 8.1*  MG 1.4* 1.5*  PHOS 2.2* 1.2*    Studies: No results found.  Scheduled Meds:  heparin  5,000 Units Subcutaneous Q8H   insulin aspart  0-20 Units Subcutaneous TID WC   ipratropium  2.5 mL Inhalation Q6H   Continuous Infusions:  cefTRIAXone (ROCEPHIN)  IV Stopped (12/03/22 2352)   lactated ringers 100 mL/hr at 12/04/22 1052   PRN Meds: albuterol, dextromethorphan-guaiFENesin, ibuprofen, ondansetron (ZOFRAN) IV  Time spent: 55 minutes  Author: Gillis Santa. MD [facility] 12/04/2022 3:32 PM  To reach On-call, see care teams to locate the attending and reach out to them via [email or web address]. If 7PM-7AM, please contact night-coverage If you still have difficulty reaching the attending provider, please page the [facility] (Director on Call) for [facility]  on amion for assistance.

## 2022-12-04 NOTE — Progress Notes (Signed)
PHARMACY - PHYSICIAN COMMUNICATION CRITICAL VALUE ALERT - BLOOD CULTURE IDENTIFICATION (BCID)  Lawrence Knight is an 22 y.o. male who presented to [facility] on 12/03/2022 with a chief complaint of pyelonephritis.   Assessment:  E coli in 4 of 4 bottles, no resistance (include suspected source if known)  Name of physician (or Provider) Contacted: Mansy   Current antibiotics: Ceftriaxone 2 gm IV Q24H   Changes to prescribed antibiotics recommended:  None, pt is on current recommended abx   Results for orders placed or performed during the hospital encounter of 12/03/22  Blood Culture ID Panel (Reflexed) (Collected: 12/03/2022  1:21 PM)  Result Value Ref Range   Enterococcus faecalis NOT DETECTED NOT DETECTED   Enterococcus Faecium NOT DETECTED NOT DETECTED   Listeria monocytogenes NOT DETECTED NOT DETECTED   Staphylococcus species NOT DETECTED NOT DETECTED   Staphylococcus aureus (BCID) NOT DETECTED NOT DETECTED   Staphylococcus epidermidis NOT DETECTED NOT DETECTED   Staphylococcus lugdunensis NOT DETECTED NOT DETECTED   Streptococcus species NOT DETECTED NOT DETECTED   Streptococcus agalactiae NOT DETECTED NOT DETECTED   Streptococcus pneumoniae NOT DETECTED NOT DETECTED   Streptococcus pyogenes NOT DETECTED NOT DETECTED   A.calcoaceticus-baumannii NOT DETECTED NOT DETECTED   Bacteroides fragilis NOT DETECTED NOT DETECTED   Enterobacterales DETECTED (A) NOT DETECTED   Enterobacter cloacae complex NOT DETECTED NOT DETECTED   Escherichia coli DETECTED (A) NOT DETECTED   Klebsiella aerogenes NOT DETECTED NOT DETECTED   Klebsiella oxytoca NOT DETECTED NOT DETECTED   Klebsiella pneumoniae NOT DETECTED NOT DETECTED   Proteus species NOT DETECTED NOT DETECTED   Salmonella species NOT DETECTED NOT DETECTED   Serratia marcescens NOT DETECTED NOT DETECTED   Haemophilus influenzae NOT DETECTED NOT DETECTED   Neisseria meningitidis NOT DETECTED NOT DETECTED   Pseudomonas aeruginosa NOT  DETECTED NOT DETECTED   Stenotrophomonas maltophilia NOT DETECTED NOT DETECTED   Candida albicans NOT DETECTED NOT DETECTED   Candida auris NOT DETECTED NOT DETECTED   Candida glabrata NOT DETECTED NOT DETECTED   Candida krusei NOT DETECTED NOT DETECTED   Candida parapsilosis NOT DETECTED NOT DETECTED   Candida tropicalis NOT DETECTED NOT DETECTED   Cryptococcus neoformans/gattii NOT DETECTED NOT DETECTED   CTX-M ESBL NOT DETECTED NOT DETECTED   Carbapenem resistance IMP NOT DETECTED NOT DETECTED   Carbapenem resistance KPC NOT DETECTED NOT DETECTED   Carbapenem resistance NDM NOT DETECTED NOT DETECTED   Carbapenem resist OXA 48 LIKE NOT DETECTED NOT DETECTED   Carbapenem resistance VIM NOT DETECTED NOT DETECTED    Brilynn Biasi D 12/04/2022  2:42 AM

## 2022-12-04 NOTE — Plan of Care (Signed)

## 2022-12-04 NOTE — ED Notes (Addendum)
This tech noticed pt pulse rate increased from 138 to 160. This tech to rm with pt and Reaonna, RN was called and informed and came to the rm as well. Pt was attempting to get out of the bed to ambulate to the bathroom d/t having the urge to have a bowel movement. Pt was assisted to the bathroom with this tech and RN by his side. Pt incontinent of stool. Bed linen changed at this time. Pt given a gown and warm wipes.

## 2022-12-05 DIAGNOSIS — N10 Acute tubulo-interstitial nephritis: Secondary | ICD-10-CM | POA: Diagnosis not present

## 2022-12-05 LAB — MAGNESIUM: Magnesium: 2.2 mg/dL (ref 1.7–2.4)

## 2022-12-05 LAB — GASTROINTESTINAL PANEL BY PCR, STOOL (REPLACES STOOL CULTURE)

## 2022-12-05 LAB — HEPATIC FUNCTION PANEL
ALT: 48 U/L — ABNORMAL HIGH (ref 0–44)
AST: 31 U/L (ref 15–41)
Albumin: 2.7 g/dL — ABNORMAL LOW (ref 3.5–5.0)
Alkaline Phosphatase: 86 U/L (ref 38–126)
Bilirubin, Direct: 0.7 mg/dL — ABNORMAL HIGH (ref 0.0–0.2)
Indirect Bilirubin: 1.1 mg/dL — ABNORMAL HIGH (ref 0.3–0.9)
Total Bilirubin: 1.8 mg/dL — ABNORMAL HIGH (ref <1.2)
Total Protein: 6.8 g/dL (ref 6.5–8.1)

## 2022-12-05 LAB — C DIFFICILE QUICK SCREEN W PCR REFLEX
C Diff antigen: NEGATIVE
C Diff interpretation: NOT DETECTED
C Diff toxin: NEGATIVE

## 2022-12-05 LAB — URINE CULTURE: Culture: <10000 — AB

## 2022-12-05 LAB — CBC
HCT: 32.4 % — ABNORMAL LOW (ref 39.0–52.0)
Hemoglobin: 11.3 g/dL — ABNORMAL LOW (ref 13.0–17.0)
MCH: 26.2 pg (ref 26.0–34.0)
MCHC: 34.9 g/dL (ref 30.0–36.0)
MCV: 75.2 fL — ABNORMAL LOW (ref 80.0–100.0)
Platelets: 179 10*3/uL (ref 150–400)
RBC: 4.31 MIL/uL (ref 4.22–5.81)
RDW: 13 % (ref 11.5–15.5)
WBC: 12.9 10*3/uL — ABNORMAL HIGH (ref 4.0–10.5)
nRBC: 0 % (ref 0.0–0.2)

## 2022-12-05 LAB — BASIC METABOLIC PANEL
Anion gap: 11 (ref 5–15)
BUN: 18 mg/dL (ref 6–20)
CO2: 22 mmol/L (ref 22–32)
Calcium: 8.1 mg/dL — ABNORMAL LOW (ref 8.9–10.3)
Chloride: 99 mmol/L (ref 98–111)
Creatinine, Ser: 1.51 mg/dL — ABNORMAL HIGH (ref 0.61–1.24)
GFR, Estimated: >60 mL/min (ref >60)
Glucose, Bld: 190 mg/dL — ABNORMAL HIGH (ref 70–99)
Potassium: 3.5 mmol/L (ref 3.5–5.1)
Sodium: 132 mmol/L — ABNORMAL LOW (ref 135–145)

## 2022-12-05 LAB — GLUCOSE, CAPILLARY
Glucose-Capillary: 181 mg/dL — ABNORMAL HIGH (ref 70–99)
Glucose-Capillary: 219 mg/dL — ABNORMAL HIGH (ref 70–99)
Glucose-Capillary: 216 mg/dL — ABNORMAL HIGH (ref 70–99)
Glucose-Capillary: 213 mg/dL — ABNORMAL HIGH (ref 70–99)
Glucose-Capillary: 200 mg/dL — ABNORMAL HIGH (ref 70–99)

## 2022-12-05 LAB — BRAIN NATRIURETIC PEPTIDE: B Natriuretic Peptide: 65.2 pg/mL (ref 0.0–100.0)

## 2022-12-05 LAB — PHOSPHORUS: Phosphorus: 2 mg/dL — ABNORMAL LOW (ref 2.5–4.6)

## 2022-12-05 LAB — HEMOGLOBIN A1C
Hgb A1c MFr Bld: 7 % — ABNORMAL HIGH (ref 4.8–5.6)
Mean Plasma Glucose: 154 mg/dL

## 2022-12-05 MED ORDER — LEVALBUTEROL HCL 1.25 MG/0.5ML IN NEBU: 1.2500 mg | INHALATION_SOLUTION | Freq: Two times a day (BID) | RESPIRATORY_TRACT | Status: DC

## 2022-12-05 MED ORDER — K PHOS MONO-SOD PHOS DI & MONO 155-852-130 MG PO TABS
500.0000 mg | ORAL_TABLET | ORAL | Status: DC
Start: 2022-12-05 — End: 2022-12-05
  Administered 2022-12-05: 500 mg via ORAL
  Filled 2022-12-05 (×2): qty 2

## 2022-12-05 MED ORDER — LEVALBUTEROL HCL 1.25 MG/0.5ML IN NEBU
1.2500 mg | INHALATION_SOLUTION | Freq: Four times a day (QID) | RESPIRATORY_TRACT | Status: DC
  Administered 2022-12-05: 1.25 mg via RESPIRATORY_TRACT
  Filled 2022-12-05: qty 0.5

## 2022-12-05 MED ORDER — POTASSIUM PHOSPHATES 15 MMOLE/5ML IV SOLN
15.0000 mmol | Freq: Once | INTRAVENOUS | Status: AC
  Administered 2022-12-05: 15 mmol via INTRAVENOUS
  Filled 2022-12-05: qty 5

## 2022-12-05 MED ORDER — INSULIN GLARGINE-YFGN 100 UNIT/ML ~~LOC~~ SOLN
10.0000 [IU] | Freq: Every day | SUBCUTANEOUS | Status: DC
  Filled 2022-12-05: qty 0.1

## 2022-12-05 MED ORDER — POTASSIUM CHLORIDE CRYS ER 20 MEQ PO TBCR: 20.0000 meq | EXTENDED_RELEASE_TABLET | Freq: Once | ORAL | Status: DC

## 2022-12-05 MED ORDER — ALBUTEROL SULFATE (2.5 MG/3ML) 0.083% IN NEBU: 3.0000 mL | INHALATION_SOLUTION | Freq: Four times a day (QID) | RESPIRATORY_TRACT | Status: DC

## 2022-12-05 MED ORDER — IPRATROPIUM BROMIDE 0.02 % IN SOLN
0.5000 mg | Freq: Four times a day (QID) | RESPIRATORY_TRACT | Status: DC
  Administered 2022-12-05: 0.5 mg via RESPIRATORY_TRACT
  Filled 2022-12-05: qty 2.5

## 2022-12-05 MED ORDER — LEVALBUTEROL HCL 1.25 MG/0.5ML IN NEBU
1.2500 mg | INHALATION_SOLUTION | Freq: Two times a day (BID) | RESPIRATORY_TRACT | Status: DC
  Administered 2022-12-05 – 2022-12-06 (×2): 1.25 mg via RESPIRATORY_TRACT
  Filled 2022-12-05 (×2): qty 0.5

## 2022-12-05 MED ORDER — IPRATROPIUM BROMIDE 0.02 % IN SOLN: 0.5000 mg | Freq: Four times a day (QID) | RESPIRATORY_TRACT | Status: DC

## 2022-12-05 MED ORDER — INSULIN GLARGINE-YFGN 100 UNIT/ML ~~LOC~~ SOLN
10.0000 [IU] | Freq: Every day | SUBCUTANEOUS | Status: DC
Start: 2022-12-05 — End: 2022-12-06
  Administered 2022-12-05: 10 [IU] via SUBCUTANEOUS
  Filled 2022-12-05 (×2): qty 0.1

## 2022-12-05 MED ORDER — LIVING WELL WITH DIABETES BOOK
Freq: Once | Status: AC
  Filled 2022-12-05: qty 1

## 2022-12-05 MED ORDER — IPRATROPIUM BROMIDE 0.02 % IN SOLN
0.5000 mg | Freq: Two times a day (BID) | RESPIRATORY_TRACT | Status: DC
  Administered 2022-12-05 – 2022-12-06 (×2): 0.5 mg via RESPIRATORY_TRACT
  Filled 2022-12-05 (×2): qty 2.5

## 2022-12-05 MED ORDER — SACCHAROMYCES BOULARDII 250 MG PO CAPS
250.0000 mg | ORAL_CAPSULE | Freq: Two times a day (BID) | ORAL | Status: DC
  Administered 2022-12-05 – 2022-12-10 (×11): 250 mg via ORAL
  Filled 2022-12-05 (×11): qty 1

## 2022-12-05 MED ORDER — FLUTICASONE FUROATE-VILANTEROL 200-25 MCG/ACT IN AEPB
1.0000 | INHALATION_SPRAY | Freq: Every day | RESPIRATORY_TRACT | Status: DC
  Administered 2022-12-05 – 2022-12-10 (×6): 1 via RESPIRATORY_TRACT
  Filled 2022-12-05: qty 28

## 2022-12-05 NOTE — Inpatient Diabetes Management (Addendum)
Inpatient Diabetes Program Recommendations  AACE/ADA: New Consensus Statement on Inpatient Glycemic Control (2015)  Target Ranges:  Prepandial:   less than 140 mg/dL      Peak postprandial:   less than 180 mg/dL (1-2 hours)      Critically ill patients:  140 - 180 mg/dL    Latest Reference Range & Units 12/04/22 06:30  Hemoglobin A1C 4.8 - 5.6 % 7.0 (H)  154 mg/dl  (H): Data is abnormally high  Latest Reference Range & Units 12/04/22 08:09 12/04/22 11:52 12/04/22 17:59 12/04/22 20:55  Glucose-Capillary 70 - 99 mg/dL 161 (H)  7 units Novolog  205 (H)  7 units Novolog  207 (H)  7 units Novolog  234 (H)  (H): Data is abnormally high  Latest Reference Range & Units 12/05/22 09:24  Glucose-Capillary 70 - 99 mg/dL 096 (H)  4 units Novolog   (H): Data is abnormally high  Admit with:  Dysuria, increased urinary frequency, right flank pain, fever, chills  Sepsis due to acute pyelonephritis  New Diagnosis Diabetes  History: Morbid Obesity (BMI >70)  Current Orders: Novolog Resistant Correction Scale/ SSI (0-20 units) TID AC    MD- Note AM CBGs >200 the last 2 days  Pleas consider starting Semglee 10 units Daily (0.05 units/kg)  Given A1c only 7%, may be able to manage glucose levels with oral DM meds and Lifestyle changes at home  Plan to speak with pt today about new diagnosis    Addendum 12pm--Met w/ pt at bedside.  He was awake lying flat in bed but able to have meaningful conversation.  Kept conversation brief as pt still nt feeling 100% better.  Pt told me his Mom and her side of the family have diabetes.  Pt lives with parents.  Does not have PCP.   Diabetes Education book was ordered and at bedside.  Spoke with pt about new diagnosis.  Discussed A1C results with him and explained what an A1C is, basic pathophysiology of DM Type 2, basic home care, basic diabetes diet nutrition principles, importance of checking CBGs and maintaining good CBG control to prevent  long-term and short-term complications.  Reviewed signs and symptoms of hyperglycemia and hypoglycemia and how to treat hypoglycemia at home.  Also reviewed blood sugar goals and A1c goals for home.  Asked pt to please begin reading through the living well with diabetes book when he feels better.    RNs to provide ongoing basic DM education at bedside with this patient.  Have ordered educational booklet.  Will plan to re-visit with pt 11/22 to provide additional education.  Hopefully will be able to speak with pt's Mom as well.      --Will follow patient during hospitalization--  Ambrose Finland RN, MSN, CDCES Diabetes Coordinator Inpatient Glycemic Control Team Team Pager: [phone] (8a-5p)

## 2022-12-05 NOTE — Progress Notes (Addendum)
Transition of Care [facility]) - Inpatient Brief Assessment   Patient Details  Name: Lawrence Knight MRN: [id number] Date of Birth: [birth year]  [facility] CM/SW Contact:    Truddie Hidden, RN Phone Number: 12/05/2022, 10:25 AM   Clinical Narrative: TOC continuing to follow patient's progress throughout discharge planning.  Request for oxygen sent to [facility] from [employer].   Transition of Care Asessment: Insurance and Status: Insurance coverage has been reviewed Patient has primary care physician: Yes Home environment has been reviewed: Home Prior level of function:: Independent Prior/Current Home Services: No current home services Social Determinants of Health Reivew: SDOH reviewed no interventions necessary Readmission risk has been reviewed: Yes Transition of care needs: no transition of care needs at this time

## 2022-12-05 NOTE — Progress Notes (Signed)
[facility] Progress Note  Patient: Lawrence Knight    NWG:[id number]  DOA: 12/03/2022     Date of Service: the patient was seen and examined on 12/05/2022  Chief Complaint  Patient presents with   Fever   Dysuria   Flank Pain   Brief hospital course: [name] is a 22 y.o. male with medical history significant of morbid obesity with BMI 71, asthma, who presents with dysuria, increased urinary frequency, right flank pain, fever and chills.   Patient states that his symptoms started yesterday, including right flank pain, dysuria, increased urinary frequency, fever and chills.  No burning on urination.  Patient has dry cough, denies SOB or chest pain.  He has nausea, no vomiting, diarrhea or abdominal pain.  Per report, patient had wheezing initially, which has resolved.  When I saw patient in ED, patient does not have wheezing on my examination.  ED Course: Leukocytosis WBC 29.2, potassium 3.3, GFR> 60, positive urinalysis (cloudy appearance, moderate amount leukocyte, positive nitrite, few bacteria, WBC> 50), negative PCR for COVID, flu and RSV, abnormal liver function (ALP 86, AST 25, ALT 55, total bilirubin 2.3), lactic acid 1.8.  Temperature while 3.1, blood pressure 175/95, 131/98, heart rate 150 --> 120-130s, RR 29, oxygen saturation 100% on room air.  Chest x-ray showed bronchitis change.  EKG: I have personally reviewed.  Sinus rhythm, QTc 415, mild T wave inversion in inferior leads and V4-V6.  CT-renal stone  Mild perinephric stranding around right kidney without hydronephrosis. This could be related to recently passed stone or pyelonephritis. Recommend clinical correlation.   Assessment and Plan:  Sepsis due to acute right pyelonephritis: Patient has sepsis with WBC 29.2, heart rate up to 150, RR up to 25-30s, temperature 102.3 0.1.  Lactic acid is normal, 1.8 --> 1.6.   CT per renal stone protocol is negative for hydronephrosis, but showed mild perinephric stranding  around right kidney, could be related to recent passage of stone or pyelonephritis.   Patient is at high risk of developing hypotension. Abnormal LFTs: Mild abnormal liver function, possibly due to sepsis. hepatitis panel negative -IV Rocephin (patient received 1 dose of vancomycin and cefepime in ED) -Follow-up of blood culture, growing E. coli, follow sensitivity report urine culture < 10k colonies insignificant growth -IV fluid: 3 L LR, 900 cc/h -Continue Tylenol as needed -As needed Zofran for nausea     Acute hypoxic respiratory failure most likely OSA, hypoventilation syndrome due to morbid obesity Continue supplemental O2 in addition D-dimer 3.6 elevated, CTA negative for PE BNP 65  Sinus tachycardia most likely due to hypoventilation syndrome and OSA possible pulmonary hypertension Follow 2D echocardiogram  Asthma:  -Bronchodilators and as needed Mucinex  AKI due to sepsis Creatinine 1.68--1.5 Continue IV fluid for hydration Avoid nephrotoxic medications, use renally dose medications Monitor renal functions and urine output  Diarrhea, most likely noninfectious Follow C. difficile and GI pathogen Started probiotics  Hypokalemia, potassium repleted. Hypophosphatemia, Phos repleted. Hypomagnesemia, mag repleted. Monitor electrolytes and replete as needed Pharmacy consulted for electrolyte monitoring and replacement  Iron deficiency anemia, transferrin saturation 8% Avoided IV iron due to active infection, started oral iron supplement Follow with PCP to repeat iron profile after 3 to 6 months  Vitamin D deficiency: started vitamin D 50,000 units p.o. weekly, follow with PCP to repeat vitamin D level after 3 to 6 months.  Vitamin B12 level 218, goal >400, started vitamin B12 1000 mcg IM injection during hospital stay followed by oral supplement.  Follow  with PCP to repeat B12 level after 3 to 6 months.  Diabetes mellitus type 2, hemoglobin A1c 7.0, new  diagnosis. Patient presented with hyperglycemia, no history of diabetes. Family history of diabetes on mother side Started Semglee 10 units daily Started NovoLog sliding scale Monitor CBG, continue diabetic diet Diabetic coordinator consulted and nutritionist consulted for education and counseling   Abnormal thyroid function test, elevated free T4 TSH 1.37 within normal range Free T4 level 1.16 Follow repeat free T4 level and follow T3 level   Morbid obesity with BMI of 70 and over, adult [facility]): Body weight 239 kg, BMI 71.47 -Encouraged losing weight -Exercise and healthy diet Body mass index is 71.47 kg/m.  Interventions: Calorie restricted diet and daily exercise advised to lose body weight.  Lifestyle modification discussed.   Diet: Diabetic diet DVT Prophylaxis: Subcutaneous Heparin    Advance goals of care discussion: Full code  Family Communication: family was not present at bedside, at the time of interview.  The pt provided permission to discuss medical plan with the family. Opportunity was given to ask question and all questions were answered satisfactorily.   Disposition:  Pt is from Home, admitted with sepsis due to pyelo, still on Iv Abx, which precludes a safe discharge. Discharge to home, when may need few days to improve.  Subjective: No significant events overnight, overall patient is feeling improvement, denied any worsening of shortness of breath, no chest pain or palpitation, no any other complaints.   Physical Exam: General: NAD, lying comfortably Appear in no distress, affect appropriate Eyes: PERRLA ENT: Oral Mucosa Clear, moist  Neck: no JVD,  Cardiovascular: S1 and S2 Present, no Murmur,  Respiratory: Equal air entry bilaterally, mild by lateral crackles, no wheezing appreciated Abdomen: Bowel Sound present, Soft, obese and no tenderness,  Skin: no rashes Extremities: no Pedal edema, no calf tenderness Neurologic: without any new focal  findings Gait not checked due to patient safety concerns  Vitals:   12/05/22 0923 12/05/22 1057 12/05/22 1612 12/05/22 1627  BP:  (!) 145/80 (!) 146/85   Pulse:  (!) 114 (!) 120 (!) 118  Resp:   (!) 35 (!) 32  Temp: 98.7 F (37.1 C)  (!) 103.2 F (39.6 C) 100.3 F (37.9 C)  TempSrc: Oral  Oral Oral  SpO2:   93% 92%  Weight:      Height:        Intake/Output Summary (Last 24 hours) at 12/05/2022 1638 Last data filed at 12/05/2022 1221 Gross per 24 hour  Intake --  Output 1000 ml  Net -1000 ml   Filed Weights   12/03/22 1315  Weight: (!) 239 kg    Data Reviewed: I have personally reviewed and interpreted daily labs, tele strips, imagings as discussed above. I reviewed all nursing notes, pharmacy notes, vitals, pertinent old records I have discussed plan of care as described above with RN and patient/family.  CBC: Recent Labs  Lab 12/03/22 1322 12/04/22 0630 12/05/22 0402  WBC 29.2* 18.6* 12.9*  NEUTROABS 24.7*  --   --   HGB 13.3 11.4* 11.3*  HCT 37.6* 31.9* 32.4*  MCV 76.7* 76.9* 75.2*  PLT 239 180 179   Basic Metabolic Panel: Recent Labs  Lab 12/03/22 1322 12/04/22 0630 12/04/22 1752 12/05/22 0402  NA 134* 136 132* 132*  K 3.3* 3.4* 3.7 3.5  CL 102 105 101 99  CO2 21* 20* 22 22  GLUCOSE 219* 241* 232* 190*  BUN 12 18 19 18   CREATININE  1.14 1.68* 1.39* 1.51*  CALCIUM 8.4* 8.1* 8.0* 8.1*  MG 1.4* 1.5*  --  2.2  PHOS 2.2* 1.2*  --  2.0*    Studies: CT Angio Chest Pulmonary Embolism (PE) W or WO Contrast  Result Date: 12/04/2022 CLINICAL DATA:  Right-sided pain with fever and chills, initial encounter EXAM: CT ANGIOGRAPHY CHEST WITH CONTRAST TECHNIQUE: Multidetector CT imaging of the chest was performed using the standard protocol during bolus administration of intravenous contrast. Multiplanar CT image reconstructions and MIPs were obtained to evaluate the vascular anatomy. RADIATION DOSE REDUCTION: This exam was performed according to the  departmental dose-optimization program which includes automated exposure control, adjustment of the mA and/or kV according to patient size and/or use of iterative reconstruction technique. CONTRAST:  OMNIPAQUE IOHEXOL 350 MG/ML SOLN COMPARISON:  Chest x-ray from the previous day. FINDINGS: Cardiovascular: Thoracic aorta and its branches are within normal limits. No cardiac enlargement is seen. The pulmonary artery shows a normal branching pattern bilaterally. No pulmonary emboli are seen. Mediastinum/Nodes: Thoracic inlet is within normal limits. No hilar or mediastinal adenopathy is noted. The esophagus as visualized is within normal limits. Lungs/Pleura: Lungs are well aerated bilaterally. No focal infiltrate or sizable effusion is seen. Bibasilar atelectatic changes are noted. A few calcified granulomas are seen as well. Upper Abdomen: Fatty infiltration of the liver is noted. The remainder of the upper abdomen is within normal limits. Musculoskeletal: No chest wall abnormality. No acute or significant osseous findings. Review of the MIP images confirms the above findings. IMPRESSION: No evidence of pulmonary emboli. Bibasilar atelectasis. Changes of prior granulomatous disease. Electronically Signed   By: Alcide Clever M.D.   On: 12/04/2022 22:20    Scheduled Meds:  cyanocobalamin  1,000 mcg Intramuscular Daily   Followed by   Melene Muller ON 12/11/2022] vitamin B-12  1,000 mcg Oral Daily   fluticasone furoate-vilanterol  1 puff Inhalation Daily   heparin  5,000 Units Subcutaneous Q8H   insulin aspart  0-20 Units Subcutaneous TID WC   levalbuterol  1.25 mg Nebulization BID   And   ipratropium  0.5 mg Nebulization BID   iron polysaccharides  150 mg Oral Daily   metoprolol tartrate  50 mg Oral BID   saccharomyces boulardii  250 mg Oral BID   Vitamin D (Ergocalciferol)  50,000 Units Oral Q7 days   Continuous Infusions:  cefTRIAXone (ROCEPHIN)  IV 2 g (12/04/22 2051)   potassium PHOSPHATE IVPB (in  mmol) 15 mmol (12/05/22 1100)   PRN Meds: acetaminophen, dextromethorphan-guaiFENesin, HYDROmorphone, ondansetron (ZOFRAN) IV  Time spent: 55 minutes  Author: Gillis Santa. MD [facility] 12/05/2022 4:38 PM  To reach On-call, see care teams to locate the attending and reach out to them via [email or web address]. If 7PM-7AM, please contact night-coverage If you still have difficulty reaching the attending provider, please page the [facility] (Director on Call) for [facility] on amion for assistance.

## 2022-12-05 NOTE — Progress Notes (Signed)
       CROSS COVER NOTE  NAME: Lawrence Knight MRN: [id number] DOB : 06-Jul-2000    Concern as stated by nurse / staff    Red mews follow up    Pertinent findings on chart review: 22 year old morbidly obese male with asthma history admitted for acute pyelo. He was also found to ha B12, D and iron deficiency as well as abnormal LFTs (now resolved)  Assessment and  Interventions   Assessment: Patient alert, no acute distress CTA PE IMPRESSION: No evidence of pulmonary emboli.  Bibasilar atelectasis.  Changes of prior granulomatous disease. Plan: Incentive spirometry Flutter valve Progressive mobility       Donnie Mesa NP [facility] Cross Cover 7pm-7am - check amion for availability Pager [phone]

## 2022-12-05 NOTE — Discharge Instructions (Addendum)

## 2022-12-05 NOTE — Plan of Care (Signed)

## 2022-12-05 NOTE — Consult Note (Addendum)
PHARMACY CONSULT NOTE - ELECTROLYTES  Pharmacy Consult for Electrolyte Monitoring and Replacement   Recent Labs: Height: 6' (182.9 cm) Weight: (!) 239 kg (527 lb) IBW/kg (Calculated) : 77.6 Estimated Creatinine Clearance: 151.7 mL/min (A) (by C-G formula based on SCr of 1.51 mg/dL (H)). Potassium (mmol/L)  Date Value  12/05/2022 3.5   Magnesium (mg/dL)  Date Value  40/98/1191 2.2   Calcium (mg/dL)  Date Value  47/82/9562 8.1 (L)   Albumin (g/dL)  Date Value  13/08/6576 2.7 (L)   Phosphorus (mg/dL)  Date Value  46/96/2952 2.0 (L)   Sodium (mmol/L)  Date Value  12/05/2022 132 (L)    Assessment  Lawrence Knight is a 22 y.o. male presenting with right flank pain and dysuria. PMH significant for morbid obesity, asthma. Patient admitted for sepsis secondary to pyelonephritis. Pharmacy has been consulted to monitor and replace electrolytes.  Diet: Low carbohydrate MIVF: LR @ 100 mL/hr Pertinent medications: N/A  Goal of Therapy: Electrolytes WNL 12/05/2022 K 3.5  Mg 2.2  Phos 2.0   Plan:  Give KCl PO 20 mEq x 1 Check BMP, Mg, Phos with AM labs  Thank you for allowing pharmacy to be a part of this patient's care.  Effie Shy, PharmD Pharmacy Resident  12/05/2022 7:28 AM

## 2022-12-05 NOTE — Plan of Care (Signed)
  RD consulted for nutrition education regarding diabetes.   Lab Results  Component Value Date   HGBA1C 7.0 (H) 12/04/2022   PTA DM medications are .   Labs reviewed: CBGS: 181-219 (inpatient orders for glycemic control are 0-20 units insulin aspart TID with meals).    Pt with new onset DM as identified by Hgb A1c in the hospital. Per DM coordinator, will likely require medications and lifestyle changes at home.   Spoke with pt at bedside, who reports feeling well secondary to having a headache. He shares that he only ate some apple slices and applesauce due to this. He reports decreased oral intake due to feeling unwell since 2 days PTA. Prior to acute illness, pt reports good appetite, consuming 3 meals per day. He mom prepares all meals, which usually consists of a pasta, vegetable, and protein. Pt also admits to drinking Sprite, Pepsi, Gatorade, and water.   When discussing DM diagnosis, pt reports "they told me that my blood sugar is high". Discussed that CBGS were high, as well as HGB A1c (and what that measures). Discussed rationale for carb modified diet and how this will help manage blood sugar both short term and long term. RD spent most of the visit discussing beverage choices and how sugary beverages can contribute to blood sugar levels. Identified favorite beverages and identified ways to reduce sugary beverages as well as lowe sugar, lower calorie alternatives, such as flavored waters, diet sodas, and lowe sugar sport drinks. Pt amenable to plan.   RD provided "Carbohydrate Counting for People with Diabetes" and Plate Method handouts from the Academy of Nutrition and Dietetics. Discussed different food groups and their effects on blood sugar, emphasizing carbohydrate-containing foods. Provided list of carbohydrates and recommended serving sizes of common foods.  Discussed importance of controlled and consistent carbohydrate intake throughout the day. Provided examples of ways to  balance meals/snacks and encouraged intake of high-fiber, whole grain complex carbohydrates. Teach back method used.  Expect fair compliance.  RD also provided referral for [facility] for further support and reinforcement.   Body mass index is 71.47 kg/m. Pt meets criteria for obesity, class III based on current BMI.Obesity is a complex, chronic medical condition that is optimally managed by a multidisciplinary care team. Weight loss is not an ideal goal for an acute inpatient hospitalization. However, if further work-up for obesity is warranted, consider outpatient referral to [facility].    Current diet order is carb modified, patient is consuming approximately 100% of meals at this time. Labs and medications reviewed. No further nutrition interventions warranted at this time. RD contact information provided. If additional nutrition issues arise, please re-consult RD.  Levada Schilling, RD, LDN, CDCES Registered Dietitian III Certified Diabetes Care and Education Specialist Please refer to [facility] for RD and/or RD on-call/weekend/after hours pager

## 2022-12-06 ENCOUNTER — Inpatient Hospital Stay (HOSPITAL_COMMUNITY)
Admit: 2022-12-06 | Discharge: 2022-12-06 | Disposition: A | Discharge status: Home / Self Care | Payer: Medicaid Other | Attending: Student | Admitting: Student

## 2022-12-06 DIAGNOSIS — R9431 Abnormal electrocardiogram [ECG] [EKG]: Secondary | ICD-10-CM | POA: Diagnosis not present

## 2022-12-06 DIAGNOSIS — N10 Acute tubulo-interstitial nephritis: Secondary | ICD-10-CM | POA: Diagnosis not present

## 2022-12-06 LAB — HEPATIC FUNCTION PANEL
ALT: 54 U/L — ABNORMAL HIGH (ref 0–44)
AST: 45 U/L — ABNORMAL HIGH (ref 15–41)
Albumin: 2.5 g/dL — ABNORMAL LOW (ref 3.5–5.0)
Alkaline Phosphatase: 81 U/L (ref 38–126)
Bilirubin, Direct: 0.7 mg/dL — ABNORMAL HIGH (ref 0.0–0.2)
Indirect Bilirubin: 1 mg/dL — ABNORMAL HIGH (ref 0.3–0.9)
Total Bilirubin: 1.7 mg/dL — ABNORMAL HIGH (ref <1.2)
Total Protein: 6.7 g/dL (ref 6.5–8.1)

## 2022-12-06 LAB — CULTURE, BLOOD (ROUTINE X 2): Special Requests: ADEQUATE

## 2022-12-06 LAB — BASIC METABOLIC PANEL
Anion gap: 10 (ref 5–15)
BUN: 16 mg/dL (ref 6–20)
CO2: 25 mmol/L (ref 22–32)
Calcium: 8.4 mg/dL — ABNORMAL LOW (ref 8.9–10.3)
Chloride: 100 mmol/L (ref 98–111)
Creatinine, Ser: 1.38 mg/dL — ABNORMAL HIGH (ref 0.61–1.24)
GFR, Estimated: >60 mL/min (ref >60)
Glucose, Bld: 224 mg/dL — ABNORMAL HIGH (ref 70–99)
Potassium: 3.6 mmol/L (ref 3.5–5.1)
Sodium: 135 mmol/L (ref 135–145)

## 2022-12-06 LAB — CBC
HCT: 31 % — ABNORMAL LOW (ref 39.0–52.0)
Hemoglobin: 10.9 g/dL — ABNORMAL LOW (ref 13.0–17.0)
MCH: 26.5 pg (ref 26.0–34.0)
MCHC: 35.2 g/dL (ref 30.0–36.0)
MCV: 75.2 fL — ABNORMAL LOW (ref 80.0–100.0)
Platelets: 192 10*3/uL (ref 150–400)
RBC: 4.12 MIL/uL — ABNORMAL LOW (ref 4.22–5.81)
RDW: 13.2 % (ref 11.5–15.5)
WBC: 13.2 10*3/uL — ABNORMAL HIGH (ref 4.0–10.5)
nRBC: 0 % (ref 0.0–0.2)

## 2022-12-06 LAB — GLUCOSE, CAPILLARY
Glucose-Capillary: 213 mg/dL — ABNORMAL HIGH (ref 70–99)
Glucose-Capillary: 265 mg/dL — ABNORMAL HIGH (ref 70–99)
Glucose-Capillary: 189 mg/dL — ABNORMAL HIGH (ref 70–99)
Glucose-Capillary: 196 mg/dL — ABNORMAL HIGH (ref 70–99)

## 2022-12-06 LAB — ECHOCARDIOGRAM COMPLETE
AR max vel: 3.06 cm2
AV Area VTI: 3.12 cm2
AV Area mean vel: 3.17 cm2
AV Mean grad: 4 mm[Hg]
AV Peak grad: 9.6 mm[Hg]
Ao pk vel: 1.55 m/s
Area-P 1/2: 3.14 cm2
Height: 72 in
MV VTI: 4.49 cm2
S' Lateral: 3.2 cm
Weight: 8432 [oz_av]

## 2022-12-06 LAB — T4, FREE: Free T4: 1.01 ng/dL (ref 0.61–1.12)

## 2022-12-06 LAB — PHOSPHORUS: Phosphorus: 2.3 mg/dL — ABNORMAL LOW (ref 2.5–4.6)

## 2022-12-06 LAB — MAGNESIUM: Magnesium: 2.1 mg/dL (ref 1.7–2.4)

## 2022-12-06 LAB — T3, FREE: T3, Free: 1.5 pg/mL — ABNORMAL LOW (ref 2.0–4.4)

## 2022-12-06 MED ORDER — ENOXAPARIN SODIUM 120 MG/0.8ML IJ SOSY
120.0000 mg | PREFILLED_SYRINGE | INTRAMUSCULAR | Status: DC
  Administered 2022-12-06 – 2022-12-09 (×4): 120 mg via SUBCUTANEOUS
  Filled 2022-12-06 (×4): qty 0.8

## 2022-12-06 MED ORDER — INSULIN GLARGINE-YFGN 100 UNIT/ML ~~LOC~~ SOLN
15.0000 [IU] | Freq: Every day | SUBCUTANEOUS | Status: DC
  Administered 2022-12-06 – 2022-12-09 (×4): 15 [IU] via SUBCUTANEOUS
  Filled 2022-12-06 (×5): qty 0.15

## 2022-12-06 MED ORDER — LOPERAMIDE HCL 2 MG PO CAPS: 4.0000 mg | ORAL_CAPSULE | Freq: Three times a day (TID) | ORAL | Status: DC | PRN

## 2022-12-06 MED ORDER — PERFLUTREN LIPID MICROSPHERE
1.0000 mL | INTRAVENOUS | Status: AC | PRN
  Administered 2022-12-06: 6 mL via INTRAVENOUS

## 2022-12-06 MED ORDER — K PHOS MONO-SOD PHOS DI & MONO 155-852-130 MG PO TABS
500.0000 mg | ORAL_TABLET | Freq: Four times a day (QID) | ORAL | Status: AC
  Administered 2022-12-06 (×2): 500 mg via ORAL
  Filled 2022-12-06 (×2): qty 2

## 2022-12-06 MED ORDER — CEFAZOLIN SODIUM-DEXTROSE 2-4 GM/100ML-% IV SOLN
2.0000 g | Freq: Three times a day (TID) | INTRAVENOUS | Status: DC
  Administered 2022-12-06 – 2022-12-10 (×11): 2 g via INTRAVENOUS
  Filled 2022-12-06 (×13): qty 100

## 2022-12-06 MED ORDER — LEVALBUTEROL HCL 1.25 MG/0.5ML IN NEBU: 1.2500 mg | INHALATION_SOLUTION | Freq: Four times a day (QID) | RESPIRATORY_TRACT | Status: DC | PRN

## 2022-12-06 MED ORDER — IPRATROPIUM BROMIDE 0.02 % IN SOLN: 0.5000 mg | Freq: Four times a day (QID) | RESPIRATORY_TRACT | Status: DC | PRN

## 2022-12-06 MED ORDER — LOSARTAN POTASSIUM 25 MG PO TABS
25.0000 mg | ORAL_TABLET | Freq: Every day | ORAL | Status: DC
  Administered 2022-12-06 – 2022-12-10 (×5): 25 mg via ORAL
  Filled 2022-12-06 (×5): qty 1

## 2022-12-06 MED ORDER — METOPROLOL TARTRATE 50 MG PO TABS
100.0000 mg | ORAL_TABLET | Freq: Two times a day (BID) | ORAL | Status: DC
  Administered 2022-12-06 – 2022-12-07 (×4): 100 mg via ORAL
  Filled 2022-12-06 (×5): qty 2

## 2022-12-06 MED ORDER — HYDRALAZINE HCL 20 MG/ML IJ SOLN
10.0000 mg | INTRAMUSCULAR | Status: DC | PRN
  Administered 2022-12-07 – 2022-12-10 (×4): 10 mg via INTRAVENOUS
  Filled 2022-12-06 (×4): qty 1

## 2022-12-06 NOTE — Progress Notes (Signed)
[facility] Progress Note  Patient: Lawrence Knight    QIH:[id number]  DOA: 12/03/2022     Date of Service: the patient was seen and examined on 12/06/2022  Chief Complaint  Patient presents with   Fever   Dysuria   Flank Pain   Brief hospital course: Lawrence Knight is a 23 y.o. male with medical history significant of morbid obesity with BMI 71, asthma, who presents with dysuria, increased urinary frequency, right flank pain, fever and chills.   Patient states that his symptoms started yesterday, including right flank pain, dysuria, increased urinary frequency, fever and chills.  No burning on urination.  Patient has dry cough, denies SOB or chest pain.  He has nausea, no vomiting, diarrhea or abdominal pain.  Per report, patient had wheezing initially, which has resolved.  When I saw patient in ED, patient does not have wheezing on my examination.  ED Course: Leukocytosis WBC 29.2, potassium 3.3, GFR> 60, positive urinalysis (cloudy appearance, moderate amount leukocyte, positive nitrite, few bacteria, WBC> 50), negative PCR for COVID, flu and RSV, abnormal liver function (ALP 86, AST 25, ALT 55, total bilirubin 2.3), lactic acid 1.8.  Temperature while 3.1, blood pressure 175/95, 131/98, heart rate 150 --> 120-130s, RR 29, oxygen saturation 100% on room air.  Chest x-ray showed bronchitis change.  EKG: I have personally reviewed.  Sinus rhythm, QTc 415, mild T wave inversion in inferior leads and V4-V6.  CT-renal stone  Mild perinephric stranding around right kidney without hydronephrosis. This could be related to recently passed stone or pyelonephritis. Recommend clinical correlation.   Assessment and Plan:  Sepsis due to acute right pyelonephritis: Patient has sepsis with WBC 29.2, heart rate up to 150, RR up to 25-30s, temperature 102.3 0.1.  Lactic acid is normal, 1.8 --> 1.6.   CT per renal stone protocol is negative for hydronephrosis, but showed mild perinephric stranding  around right kidney, could be related to recent passage of stone or pyelonephritis.   Patient is at high risk of developing hypotension. Abnormal LFTs: Mild abnormal liver function, possibly due to sepsis. hepatitis panel negative -IV Rocephin (patient received 1 dose of vancomycin and cefepime in ED) -Follow-up of blood culture, growing E. coli, pansensitive urine culture < 10k colonies insignificant growth -IV fluid: 3 L LR, 900 cc/h -Continue Tylenol as needed -As needed Zofran for nausea     Acute hypoxic respiratory failure most likely OSA, hypoventilation syndrome due to morbid obesity Continue supplemental O2 in addition D-dimer 3.6 elevated, CTA negative for PE BNP 65  # Hypertension, Started Lopressor 50 mg p.o. twice daily 11/22 increased Lopressor 100 mg p.o. twice daily Monitor BP and titrate medications accordingly  # Sinus tachycardia most likely due to hypoventilation syndrome and OSA possible pulmonary hypertension Follow 2D echocardiogram  Asthma:  -Bronchodilators and as needed Mucinex  AKI due to sepsis Creatinine 1.68--1.5--1.38 gradually improving S/p IV fluid for hydration, continue oral hydration Avoid nephrotoxic medications, use renally dose medications Monitor renal functions and urine output  Diarrhea, most likely noninfectious C. difficile and GI pathogen negative.  Continue Imodium as needed Started probiotics  Hypokalemia, potassium repleted.  Resolved Hypophosphatemia, Phos repleted. Hypomagnesemia, mag repleted.  Resolved Monitor electrolytes and replete as needed Pharmacy consulted for electrolyte monitoring and replacement  Iron deficiency anemia, transferrin saturation 8% Avoided IV iron due to active infection, started oral iron supplement Follow with PCP to repeat iron profile after 3 to 6 months  Vitamin D deficiency: started vitamin D 50,000 units p.o.  weekly, follow with PCP to repeat vitamin D level after 3 to 6  months.  Vitamin B12 level 218, goal >400, started vitamin B12 1000 mcg IM injection during hospital stay followed by oral supplement.  Follow with PCP to repeat B12 level after 3 to 6 months.  Diabetes mellitus type 2, hemoglobin A1c 7.0, new diagnosis. Patient presented with hyperglycemia, no history of diabetes. Family history of diabetes on mother side 11/22 increased Semglee 15 units daily Started NovoLog sliding scale Monitor CBG, continue diabetic diet Diabetic coordinator consulted and nutritionist consulted for education and counseling   Abnormal thyroid function test, elevated free T4 TSH 1.37 within normal range Free T4 level 1.16, repeat free T4 level 1.01 wnl Follow repeat free T4 level and follow T3 level   Morbid obesity with BMI of 70 and over, adult [facility]): Body weight 239 kg, BMI 71.47 -Encouraged losing weight -Exercise and healthy diet Body mass index is 71.47 kg/m.  Interventions: Calorie restricted diet and daily exercise advised to lose body weight.  Lifestyle modification discussed.   Diet: Diabetic diet DVT Prophylaxis: Subcutaneous Heparin    Advance goals of care discussion: Full code  Family Communication: family was not present at bedside, at the time of interview.  The pt provided permission to discuss medical plan with the family. Opportunity was given to ask question and all questions were answered satisfactorily.   Disposition:  Pt is from Home, admitted with sepsis due to pyelo, still on Iv Abx, which precludes a safe discharge. Discharge to home, when may need few days to improve.  Subjective: No significant events overnight, patient denied any abdominal pain, no chest pain, no palpitations, no worsening of shortness of breath.  Stated that he is feeling fine.  Physical Exam: General: NAD, lying comfortably Appear in no distress, affect appropriate Eyes: PERRLA ENT: Oral Mucosa Clear, moist  Neck: no JVD,  Cardiovascular: S1 and S2  Present, no Murmur,  Respiratory: Equal air entry bilaterally, mild by lateral crackles, no wheezing appreciated Abdomen: Bowel Sound present, Soft, obese and no tenderness,  Skin: no rashes Extremities: no Pedal edema, no calf tenderness Neurologic: without any new focal findings Gait not checked due to patient safety concerns  Vitals:   12/06/22 0714 12/06/22 0927 12/06/22 1023 12/06/22 1306  BP:  (!) 167/108    Pulse:  (!) 120 (!) 104   Resp:   (!) 34   Temp:    98.6 F (37 C)  TempSrc:    Oral  SpO2: 100%  94%   Weight:      Height:        Intake/Output Summary (Last 24 hours) at 12/06/2022 1414 Last data filed at 12/06/2022 1030 Gross per 24 hour  Intake 120 ml  Output 925 ml  Net -805 ml   Filed Weights   12/03/22 1315  Weight: (!) 239 kg    Data Reviewed: I have personally reviewed and interpreted daily labs, tele strips, imagings as discussed above. I reviewed all nursing notes, pharmacy notes, vitals, pertinent old records I have discussed plan of care as described above with RN and patient/family.  CBC: Recent Labs  Lab 12/03/22 1322 12/04/22 0630 12/05/22 0402 12/06/22 0607  WBC 29.2* 18.6* 12.9* 13.2*  NEUTROABS 24.7*  --   --   --   HGB 13.3 11.4* 11.3* 10.9*  HCT 37.6* 31.9* 32.4* 31.0*  MCV 76.7* 76.9* 75.2* 75.2*  PLT 239 180 179 192   Basic Metabolic Panel: Recent Labs  Lab  12/03/22 1322 12/04/22 0630 12/04/22 1752 12/05/22 0402 12/06/22 0607  NA 134* 136 132* 132* 135  K 3.3* 3.4* 3.7 3.5 3.6  CL 102 105 101 99 100  CO2 21* 20* 22 22 25   GLUCOSE 219* 241* 232* 190* 224*  BUN 12 18 19 18 16   CREATININE 1.14 1.68* 1.39* 1.51* 1.38*  CALCIUM 8.4* 8.1* 8.0* 8.1* 8.4*  MG 1.4* 1.5*  --  2.2 2.1  PHOS 2.2* 1.2*  --  2.0* 2.3*    Studies: ECHOCARDIOGRAM COMPLETE  Result Date: 12/06/2022    ECHOCARDIOGRAM REPORT   Patient Name:   Lawrence Knight Date of Exam: 12/06/2022 Medical Rec #:  [id number]   Height:       72.0 in Accession #:     [id number]  Weight:       527.0 lb Date of Birth:  July 04, 2000   BSA:          3.216 m Patient Age:    22 years    BP:           167/107 mmHg Patient Gender: M           HR:           115 bpm. Exam Location:  [facility] Procedure: 2D Echo, Cardiac Doppler, Color Doppler and Intracardiac            Opacification Agent Indications:     Abnormal ECG  History:         Patient has no prior history of Echocardiogram examinations.                  Abnormal ECG. TDS, Patient is mostly sitting throughout the                  study.  Sonographer:     Mikki Harbor Referring Phys:  [id number] Gillis Santa Diagnosing Phys: Debbe Odea MD  Sonographer Comments: Technically difficult study due to poor echo windows and patient is obese. IMPRESSIONS  1. Left ventricular ejection fraction, by estimation, is 55 to 60%. The left ventricle has normal function. The left ventricle has no regional wall motion abnormalities. There is mild left ventricular hypertrophy. Left ventricular diastolic function could not be evaluated.  2. Right ventricular systolic function is normal. The right ventricular size is normal.  3. The mitral valve is normal in structure. No evidence of mitral valve regurgitation.  4. The aortic valve is grossly normal. Aortic valve regurgitation is not visualized. FINDINGS  Left Ventricle: Left ventricular ejection fraction, by estimation, is 55 to 60%. The left ventricle has normal function. The left ventricle has no regional wall motion abnormalities. Definity contrast agent was given IV to delineate the left ventricular  endocardial borders. The left ventricular internal cavity size was normal in size. There is mild left ventricular hypertrophy. Left ventricular diastolic function could not be evaluated. Right Ventricle: The right ventricular size is normal. No increase in right ventricular wall thickness. Right ventricular systolic function is normal. Left Atrium: Left atrial size was normal in size. Right Atrium:  Right atrial size was not well visualized. Pericardium: There is no evidence of pericardial effusion. Mitral Valve: The mitral valve is normal in structure. No evidence of mitral valve regurgitation. MV peak gradient, 4.2 mmHg. The mean mitral valve gradient is 2.0 mmHg. Tricuspid Valve: The tricuspid valve is normal in structure. Tricuspid valve regurgitation is not demonstrated. Aortic Valve: The aortic valve is grossly normal. Aortic valve regurgitation is not visualized. Aortic valve  mean gradient measures 4.0 mmHg. Aortic valve peak gradient measures 9.6 mmHg. Aortic valve area, by VTI measures 3.12 cm. Pulmonic Valve: The pulmonic valve was normal in structure. Pulmonic valve regurgitation is not visualized. Aorta: The aortic root is normal in size and structure. IAS/Shunts: No atrial level shunt detected by color flow Doppler.  LEFT VENTRICLE PLAX 2D LVIDd:         4.50 cm LVIDs:         3.20 cm LV PW:         1.60 cm LV IVS:        1.70 cm LVOT diam:     2.40 cm LV SV:         69 LV SV Index:   22 LVOT Area:     4.52 cm  LEFT ATRIUM         Index LA diam:    3.60 cm 1.12 cm/m  AORTIC VALVE                    PULMONIC VALVE AV Area (Vmax):    3.06 cm     PV Vmax:       1.44 m/s AV Area (Vmean):   3.17 cm     PV Peak grad:  8.3 mmHg AV Area (VTI):     3.12 cm AV Vmax:           155.00 cm/s AV Vmean:          85.600 cm/s AV VTI:            0.222 m AV Peak Grad:      9.6 mmHg AV Mean Grad:      4.0 mmHg LVOT Vmax:         105.00 cm/s LVOT Vmean:        59.900 cm/s LVOT VTI:          0.153 m LVOT/AV VTI ratio: 0.69  AORTA Ao Root diam: 3.40 cm MITRAL VALVE MV Area (PHT): 3.14 cm   SHUNTS MV Area VTI:   4.49 cm   Systemic VTI:  0.15 m MV Peak grad:  4.2 mmHg   Systemic Diam: 2.40 cm MV Mean grad:  2.0 mmHg MV Vmax:       1.02 m/s MV Vmean:      62.2 cm/s Debbe Odea MD Electronically signed by Debbe Odea MD Signature Date/Time: 12/06/2022/1:11:23 PM    Final     Scheduled Meds:   cyanocobalamin  1,000 mcg Intramuscular Daily   Followed by   Melene Muller ON 12/11/2022] vitamin B-12  1,000 mcg Oral Daily   fluticasone furoate-vilanterol  1 puff Inhalation Daily   heparin  5,000 Units Subcutaneous Q8H   insulin aspart  0-20 Units Subcutaneous TID WC   insulin glargine-yfgn  15 Units Subcutaneous QHS   iron polysaccharides  150 mg Oral Daily   metoprolol tartrate  100 mg Oral BID   saccharomyces boulardii  250 mg Oral BID   Vitamin D (Ergocalciferol)  50,000 Units Oral Q7 days   Continuous Infusions:  cefTRIAXone (ROCEPHIN)  IV 2 g (12/05/22 2135)   PRN Meds: acetaminophen, dextromethorphan-guaiFENesin, HYDROmorphone, levalbuterol **AND** ipratropium, loperamide, ondansetron (ZOFRAN) IV  Time spent: 40 minutes  Author: Gillis Santa. MD [facility] 12/06/2022 2:14 PM  To reach On-call, see care teams to locate the attending and reach out to them via [email or web address]. If 7PM-7AM, please contact night-coverage If you still have difficulty reaching the attending provider, please page the [facility] (  Director on Call) for [facility] on [place] for assistance.

## 2022-12-06 NOTE — Consult Note (Signed)
PHARMACY CONSULT NOTE - ELECTROLYTES  Pharmacy Consult for Electrolyte Monitoring and Replacement   Recent Labs: Height: 6' (182.9 cm) Weight: (!) 239 kg (527 lb) IBW/kg (Calculated) : 77.6 Estimated Creatinine Clearance: 166 mL/min (A) (by C-G formula based on SCr of 1.38 mg/dL (H)). Potassium (mmol/L)  Date Value  12/06/2022 3.6   Magnesium (mg/dL)  Date Value  11/91/4782 2.1   Calcium (mg/dL)  Date Value  95/62/1308 8.4 (L)   Albumin (g/dL)  Date Value  65/78/4696 2.5 (L)   Phosphorus (mg/dL)  Date Value  29/52/8413 2.3 (L)   Sodium (mmol/L)  Date Value  12/06/2022 135   Assessment  Lawrence Knight is a 22 y.o. male presenting with right flank pain and dysuria. PMH significant for morbid obesity, asthma. Patient admitted for sepsis secondary to pyelonephritis. Pharmacy has been consulted to monitor and replace electrolytes.  Diet: Medium Carb  MIVF:  Pertinent medications: N/A  Goal of Therapy: Electrolytes WNL 12/06/2022 K 3.6  Mg 2.1  Phos 2.3   Plan:  Provider ordered Kphos 500 mg x 4 daily.  Patient got 2 doses before it was discontinued.  K is not indicated at this time. Renal function improving but still above baseline.  Check BMP, Mg, Phos with AM labs  Thank you for allowing pharmacy to be a part of this patient's care.  Effie Shy, PharmD Pharmacy Resident  12/06/2022 2:40 PM

## 2022-12-06 NOTE — Progress Notes (Signed)
*  PRELIMINARY RESULTS* Echocardiogram 2D Echocardiogram has been performed.  Carolyne Fiscal 12/06/2022, 10:39 AM

## 2022-12-06 NOTE — Plan of Care (Signed)
  Problem: Clinical Measurements: Goal: Ability to maintain clinical measurements within normal limits will improve Outcome: Not Progressing Goal: Respiratory complications will improve Outcome: Not Progressing Goal: Cardiovascular complication will be avoided Outcome: Not Progressing   Problem: Activity: Goal: Risk for activity intolerance will decrease Outcome: Progressing

## 2022-12-06 NOTE — Inpatient Diabetes Management (Addendum)
Inpatient Diabetes Program Recommendations  AACE/ADA: New Consensus Statement on Inpatient Glycemic Control (2015)  Target Ranges:  Prepandial:   less than 140 mg/dL      Peak postprandial:   less than 180 mg/dL (1-2 hours)      Critically ill patients:  140 - 180 mg/dL    Latest Reference Range & Units 12/04/22 06:30  Hemoglobin A1C 4.8 - 5.6 % 7.0 (H)  154 mg/dl  (H): Data is abnormally high  Latest Reference Range & Units 12/05/22 08:46 12/05/22 09:24 12/05/22 12:11 12/05/22 16:17 12/05/22 21:36  Glucose-Capillary 70 - 99 mg/dL 147 (H)    829 (H)  4 units Novolog  216 (H)  7 units Novolog 213 (H)  7 units Novolog  200 (H)    10 units Semglee  (H): Data is abnormally high  Latest Reference Range & Units 12/06/22 09:08  Glucose-Capillary 70 - 99 mg/dL 562 (H)  7 units Novolog   (H): Data is abnormally high  Admit with:  Dysuria, increased urinary frequency, right flank pain, fever, chills  Sepsis due to acute pyelonephritis  New Diagnosis Diabetes   History: Morbid Obesity (BMI >70)   Current Orders: Novolog Resistant Correction Scale/ SSI (0-20 units) TID AC      Semglee 10 units at bedtime      MD- Please consider increasing the Semglee to 15 units at bedtime while in hospital  Do you think pt will need insulin for home?  Given A1c only 7%, may be able to manage glucose levels with oral DM meds and Lifestyle changes at home?     Addendum 12:15pm--Met w/ pt again today at bedside.  He was feeling better today and receptive ti info provided.  Re-reviewed basic DM educational concepts that were discussed yest again today.  Re-reviewed goal CBGs and goal A1c for home.  Again stressed to pt the importance of nutrition and exercise to manage CBGs.  Asked pt to please avoid all beverages with sugar (unless having a Low blood sugar event).  Discussed with patient that MD may try to send pt home on oral DM meds to start.  Asked pt to check his CBGs at least BID at  home (prior to Breakfast and Dinner) but I did discuss with pt that checking TID would be more ideal and that I would like him to check once in a while after food intake.  Pt told me his Mom made him an appt with a new MD for January (has Medicaid coverage).      --Will follow patient during hospitalization--  Ambrose Finland RN, MSN, CDCES Diabetes Coordinator Inpatient Glycemic Control Team Team Pager: [phone] (8a-5p)

## 2022-12-07 DIAGNOSIS — N10 Acute tubulo-interstitial nephritis: Secondary | ICD-10-CM | POA: Diagnosis not present

## 2022-12-07 LAB — BASIC METABOLIC PANEL
Anion gap: 10 (ref 5–15)
BUN: 16 mg/dL (ref 6–20)
CO2: 26 mmol/L (ref 22–32)
Calcium: 8.3 mg/dL — ABNORMAL LOW (ref 8.9–10.3)
Chloride: 98 mmol/L (ref 98–111)
Creatinine, Ser: 1.07 mg/dL (ref 0.61–1.24)
GFR, Estimated: >60 mL/min (ref >60)
Glucose, Bld: 232 mg/dL — ABNORMAL HIGH (ref 70–99)
Potassium: 3.5 mmol/L (ref 3.5–5.1)
Sodium: 134 mmol/L — ABNORMAL LOW (ref 135–145)

## 2022-12-07 LAB — HEPATIC FUNCTION PANEL
ALT: 61 U/L — ABNORMAL HIGH (ref 0–44)
AST: 46 U/L — ABNORMAL HIGH (ref 15–41)
Albumin: 2.4 g/dL — ABNORMAL LOW (ref 3.5–5.0)
Alkaline Phosphatase: 79 U/L (ref 38–126)
Bilirubin, Direct: 0.4 mg/dL — ABNORMAL HIGH (ref 0.0–0.2)
Indirect Bilirubin: 0.7 mg/dL (ref 0.3–0.9)
Total Bilirubin: 1.1 mg/dL (ref <1.2)
Total Protein: 6.9 g/dL (ref 6.5–8.1)

## 2022-12-07 LAB — CBC
HCT: 30 % — ABNORMAL LOW (ref 39.0–52.0)
Hemoglobin: 10.9 g/dL — ABNORMAL LOW (ref 13.0–17.0)
MCH: 27.2 pg (ref 26.0–34.0)
MCHC: 36.3 g/dL — ABNORMAL HIGH (ref 30.0–36.0)
MCV: 74.8 fL — ABNORMAL LOW (ref 80.0–100.0)
Platelets: 204 10*3/uL (ref 150–400)
RBC: 4.01 MIL/uL — ABNORMAL LOW (ref 4.22–5.81)
RDW: 13.6 % (ref 11.5–15.5)
WBC: 16.1 10*3/uL — ABNORMAL HIGH (ref 4.0–10.5)
nRBC: 0 % (ref 0.0–0.2)

## 2022-12-07 LAB — GLUCOSE, CAPILLARY
Glucose-Capillary: 196 mg/dL — ABNORMAL HIGH (ref 70–99)
Glucose-Capillary: 225 mg/dL — ABNORMAL HIGH (ref 70–99)
Glucose-Capillary: 204 mg/dL — ABNORMAL HIGH (ref 70–99)
Glucose-Capillary: 184 mg/dL — ABNORMAL HIGH (ref 70–99)

## 2022-12-07 LAB — PHOSPHORUS: Phosphorus: 2.5 mg/dL (ref 2.5–4.6)

## 2022-12-07 LAB — MAGNESIUM: Magnesium: 2.1 mg/dL (ref 1.7–2.4)

## 2022-12-07 NOTE — Progress Notes (Signed)
[facility] Progress Note  Patient: Lawrence Knight    RUE:[id number]  DOA: 12/03/2022     Date of Service: the patient was seen and examined on 12/07/2022  Chief Complaint  Patient presents with   Fever   Dysuria   Flank Pain   Brief hospital course: Lawrence Knight is a 22 y.o. male with medical history significant of morbid obesity with BMI 71, asthma, who presents with dysuria, increased urinary frequency, right flank pain, fever and chills.   Patient states that his symptoms started yesterday, including right flank pain, dysuria, increased urinary frequency, fever and chills.  No burning on urination.  Patient has dry cough, denies SOB or chest pain.  He has nausea, no vomiting, diarrhea or abdominal pain.  Per report, patient had wheezing initially, which has resolved.  When I saw patient in ED, patient does not have wheezing on my examination.  ED Course: Leukocytosis WBC 29.2, potassium 3.3, GFR> 60, positive urinalysis (cloudy appearance, moderate amount leukocyte, positive nitrite, few bacteria, WBC> 50), negative PCR for COVID, flu and RSV, abnormal liver function (ALP 86, AST 25, ALT 55, total bilirubin 2.3), lactic acid 1.8.  Temperature while 3.1, blood pressure 175/95, 131/98, heart rate 150 --> 120-130s, RR 29, oxygen saturation 100% on room air.  Chest x-ray showed bronchitis change.  EKG: I have personally reviewed.  Sinus rhythm, QTc 415, mild T wave inversion in inferior leads and V4-V6.  CT-renal stone  Mild perinephric stranding around right kidney without hydronephrosis. This could be related to recently passed stone or pyelonephritis. Recommend clinical correlation.   Assessment and Plan:  Sepsis due to acute right pyelonephritis: Patient has sepsis with WBC 29.2, heart rate up to 150, RR up to 25-30s, temperature 102.3 0.1.  Lactic acid is normal, 1.8 --> 1.6.   CT per renal stone protocol is negative for hydronephrosis, but showed mild perinephric stranding  around right kidney, could be related to recent passage of stone or pyelonephritis.   Patient is at high risk of developing hypotension. Abnormal LFTs: Mild abnormal liver function, possibly due to sepsis. hepatitis panel negative S/p IV Rocephin (patient received 1 dose of vancomycin and cefepime in ED) 11/2 started cefazolin 2 g IV every 8 hourly -Blood culture, growing E. coli, pansensitive urine culture < 10k colonies insignificant growth -s/p IV fluid: 3 L LR, 900 cc/h -Continue Tylenol as needed -As needed Zofran for nausea     Acute hypoxic respiratory failure most likely OSA, hypoventilation syndrome due to morbid obesity Continue supplemental O2 in addition D-dimer 3.6 elevated, CTA negative for PE BNP 65  # Hypertension, Started Lopressor 50 mg p.o. twice daily 11/22 increased Lopressor 100 mg p.o. twice daily Monitor BP and titrate medications accordingly  # Sinus tachycardia most likely due to hypoventilation syndrome and OSA possible pulmonary hypertension Follow 2D echocardiogram  Asthma:  -Bronchodilators and as needed Mucinex  AKI due to sepsis Creatinine 1.68--1.5--1.38--1.07 resolved S/p IV fluid for hydration, continue oral hydration Avoid nephrotoxic medications, use renally dose medications Monitor renal functions and urine output  Diarrhea, most likely noninfectious C. difficile and GI pathogen negative.  Continue Imodium as needed Started probiotics  Hypokalemia, potassium repleted.  Resolved Hypophosphatemia, Phos repleted.  Resolved Hypomagnesemia, mag repleted.  Resolved Monitor electrolytes and replete as needed Pharmacy consulted for electrolyte monitoring and replacement  Iron deficiency anemia, transferrin saturation 8% Avoided IV iron due to active infection, started oral iron supplement Follow with PCP to repeat iron profile after 3 to 6 months  Vitamin D deficiency: started vitamin D 50,000 units p.o. weekly, follow with PCP to repeat  vitamin D level after 3 to 6 months.  Vitamin B12 level 218, goal >400, started vitamin B12 1000 mcg IM injection during hospital stay followed by oral supplement.  Follow with PCP to repeat B12 level after 3 to 6 months.  Diabetes mellitus type 2, hemoglobin A1c 7.0, new diagnosis. Patient presented with hyperglycemia, no history of diabetes. Family history of diabetes on mother side 11/22 increased Semglee 15 units daily Started NovoLog sliding scale Monitor CBG, continue diabetic diet Diabetic coordinator consulted and nutritionist consulted for education and counseling   Abnormal thyroid function test, elevated free T4 TSH 1.37 within normal range Free T4 level 1.16, repeat free T4 level 1.01 wnl Follow repeat free T4 level and follow T3 level   Morbid obesity with BMI of 70 and over, adult [facility]): Body weight 239 kg, BMI 71.47 -Encouraged losing weight -Exercise and healthy diet Body mass index is 71.47 kg/m.  Interventions: Calorie restricted diet and daily exercise advised to lose body weight.  Lifestyle modification discussed.   Diet: Diabetic diet DVT Prophylaxis: Subcutaneous Heparin    Advance goals of care discussion: Full code  Family Communication: family was not present at bedside, at the time of interview.  The pt provided permission to discuss medical plan with the family. Opportunity was given to ask question and all questions were answered satisfactorily.   Disposition:  Pt is from Home, admitted with sepsis due to pyelo, still on Iv Abx, and had fever, which precludes a safe discharge. Discharge to home, when may need few days to improve.  Subjective: No significant events overnight, patient spiked a fever Tmax 101 yesterday evening.  Denied any abdominal pain, no chest pain or palpitation, no shortness of breath, Resting comfortably.   Physical Exam: General: NAD, lying comfortably Appear in no distress, affect appropriate Eyes: PERRLA ENT: Oral  Mucosa Clear, moist  Neck: no JVD,  Cardiovascular: S1 and S2 Present, no Murmur,  Respiratory: Equal air entry bilaterally, mild by lateral crackles, no wheezing appreciated Abdomen: Bowel Sound present, Soft, obese and no tenderness,  Skin: no rashes Extremities: no Pedal edema, no calf tenderness Neurologic: without any new focal findings Gait not checked due to patient safety concerns  Vitals:   12/07/22 0000 12/07/22 0423 12/07/22 0739 12/07/22 1123  BP: (!) 149/98  (!) 144/108 (!) 134/117  Pulse: 78  (!) 101 91  Resp: (!) 23  18 18   Temp:  99.4 F (37.4 C) 98.5 F (36.9 C) 98.5 F (36.9 C)  TempSrc:      SpO2: 98%  94% 97%  Weight:      Height:        Intake/Output Summary (Last 24 hours) at 12/07/2022 1448 Last data filed at 12/07/2022 0900 Gross per 24 hour  Intake 220 ml  Output 1000 ml  Net -780 ml   Filed Weights   12/03/22 1315  Weight: (!) 239 kg    Data Reviewed: I have personally reviewed and interpreted daily labs, tele strips, imagings as discussed above. I reviewed all nursing notes, pharmacy notes, vitals, pertinent old records I have discussed plan of care as described above with RN and patient/family.  CBC: Recent Labs  Lab 12/03/22 1322 12/04/22 0630 12/05/22 0402 12/06/22 0607 12/07/22 0904  WBC 29.2* 18.6* 12.9* 13.2* 16.1*  NEUTROABS 24.7*  --   --   --   --   HGB 13.3 11.4* 11.3* 10.9*  10.9*  HCT 37.6* 31.9* 32.4* 31.0* 30.0*  MCV 76.7* 76.9* 75.2* 75.2* 74.8*  PLT 239 180 179 192 204   Basic Metabolic Panel: Recent Labs  Lab 12/03/22 1322 12/04/22 0630 12/04/22 1752 12/05/22 0402 12/06/22 0607 12/07/22 0904  NA 134* 136 132* 132* 135 134*  K 3.3* 3.4* 3.7 3.5 3.6 3.5  CL 102 105 101 99 100 98  CO2 21* 20* 22 22 25 26   GLUCOSE 219* 241* 232* 190* 224* 232*  BUN 12 18 19 18 16 16   CREATININE 1.14 1.68* 1.39* 1.51* 1.38* 1.07  CALCIUM 8.4* 8.1* 8.0* 8.1* 8.4* 8.3*  MG 1.4* 1.5*  --  2.2 2.1 2.1  PHOS 2.2* 1.2*  --   2.0* 2.3* 2.5    Studies: No results found.  Scheduled Meds:  cyanocobalamin  1,000 mcg Intramuscular Daily   Followed by   Melene Muller ON 12/11/2022] vitamin B-12  1,000 mcg Oral Daily   enoxaparin (LOVENOX) injection  120 mg Subcutaneous Q24H   fluticasone furoate-vilanterol  1 puff Inhalation Daily   insulin aspart  0-20 Units Subcutaneous TID WC   insulin glargine-yfgn  15 Units Subcutaneous QHS   iron polysaccharides  150 mg Oral Daily   losartan  25 mg Oral Daily   metoprolol tartrate  100 mg Oral BID   saccharomyces boulardii  250 mg Oral BID   Vitamin D (Ergocalciferol)  50,000 Units Oral Q7 days   Continuous Infusions:   ceFAZolin (ANCEF) IV 2 g (12/07/22 1313)   PRN Meds: acetaminophen, dextromethorphan-guaiFENesin, hydrALAZINE, HYDROmorphone, levalbuterol **AND** ipratropium, loperamide, ondansetron (ZOFRAN) IV  Time spent: 40 minutes  Author: Gillis Santa. MD [facility] 12/07/2022 2:48 PM  To reach On-call, see care teams to locate the attending and reach out to them via [email or web address]. If 7PM-7AM, please contact night-coverage If you still have difficulty reaching the attending provider, please page the [facility] (Director on Call) for [facility] on amion for assistance.

## 2022-12-07 NOTE — Plan of Care (Signed)
  Problem: Clinical Measurements: Goal: Ability to maintain clinical measurements within normal limits will improve Outcome: Progressing Goal: Respiratory complications will improve Outcome: Not Progressing   Problem: Activity: Goal: Risk for activity intolerance will decrease Outcome: Adequate for Discharge

## 2022-12-07 NOTE — Consult Note (Signed)
PHARMACY CONSULT NOTE - ELECTROLYTES  Pharmacy Consult for Electrolyte Monitoring and Replacement   Recent Labs: Height: 6' (182.9 cm) Weight: (!) 239 kg (527 lb) IBW/kg (Calculated) : 77.6 Estimated Creatinine Clearance: 214.1 mL/min (by C-G formula based on SCr of 1.07 mg/dL). Potassium (mmol/L)  Date Value  12/07/2022 3.5   Magnesium (mg/dL)  Date Value  81/19/1478 2.1   Calcium (mg/dL)  Date Value  29/56/2130 8.3 (L)   Albumin (g/dL)  Date Value  86/57/8469 2.4 (L)   Phosphorus (mg/dL)  Date Value  62/95/2841 2.5   Sodium (mmol/L)  Date Value  12/07/2022 134 (L)   Assessment  Lawrence Knight is a 22 y.o. male presenting with right flank pain and dysuria. PMH significant for morbid obesity, asthma. Patient admitted for sepsis secondary to pyelonephritis. Pharmacy has been consulted to monitor and replace electrolytes.  Diet: Medium Carb  MIVF:  Pertinent medications: N/A  Goal of Therapy: Electrolytes WNL 12/07/2022 K 3.5 Mg 2.1  Phos 2.5   Plan:  No replacement currently indicated Check BMP, Mg, Phos with AM labs  Thank you for allowing pharmacy to be a part of this patient's care.  Bettey Costa, PharmD Clinical Pharmacist 12/07/2022 1:05 PM

## 2022-12-08 ENCOUNTER — Inpatient Hospital Stay: Payer: Medicaid Other

## 2022-12-08 DIAGNOSIS — N10 Acute tubulo-interstitial nephritis: Secondary | ICD-10-CM | POA: Diagnosis not present

## 2022-12-08 LAB — BASIC METABOLIC PANEL
Anion gap: 10 (ref 5–15)
BUN: 15 mg/dL (ref 6–20)
CO2: 26 mmol/L (ref 22–32)
Calcium: 8.4 mg/dL — ABNORMAL LOW (ref 8.9–10.3)
Chloride: 99 mmol/L (ref 98–111)
Creatinine, Ser: 0.92 mg/dL (ref 0.61–1.24)
GFR, Estimated: >60 mL/min (ref >60)
Glucose, Bld: 182 mg/dL — ABNORMAL HIGH (ref 70–99)
Potassium: 3.3 mmol/L — ABNORMAL LOW (ref 3.5–5.1)
Sodium: 135 mmol/L (ref 135–145)

## 2022-12-08 LAB — CBC
HCT: 30 % — ABNORMAL LOW (ref 39.0–52.0)
Hemoglobin: 10.6 g/dL — ABNORMAL LOW (ref 13.0–17.0)
MCH: 26.6 pg (ref 26.0–34.0)
MCHC: 35.3 g/dL (ref 30.0–36.0)
MCV: 75.2 fL — ABNORMAL LOW (ref 80.0–100.0)
Platelets: 243 10*3/uL (ref 150–400)
RBC: 3.99 MIL/uL — ABNORMAL LOW (ref 4.22–5.81)
RDW: 13.5 % (ref 11.5–15.5)
WBC: 17.7 10*3/uL — ABNORMAL HIGH (ref 4.0–10.5)
nRBC: 0 % (ref 0.0–0.2)

## 2022-12-08 LAB — HEPATIC FUNCTION PANEL
ALT: 54 U/L — ABNORMAL HIGH (ref 0–44)
AST: 43 U/L — ABNORMAL HIGH (ref 15–41)
Albumin: 2.3 g/dL — ABNORMAL LOW (ref 3.5–5.0)
Alkaline Phosphatase: 77 U/L (ref 38–126)
Bilirubin, Direct: 0.3 mg/dL — ABNORMAL HIGH (ref 0.0–0.2)
Indirect Bilirubin: 0.9 mg/dL (ref 0.3–0.9)
Total Bilirubin: 1.2 mg/dL — ABNORMAL HIGH (ref <1.2)
Total Protein: 6.5 g/dL (ref 6.5–8.1)

## 2022-12-08 LAB — GLUCOSE, CAPILLARY
Glucose-Capillary: 213 mg/dL — ABNORMAL HIGH (ref 70–99)
Glucose-Capillary: 211 mg/dL — ABNORMAL HIGH (ref 70–99)
Glucose-Capillary: 197 mg/dL — ABNORMAL HIGH (ref 70–99)
Glucose-Capillary: 184 mg/dL — ABNORMAL HIGH (ref 70–99)

## 2022-12-08 LAB — MAGNESIUM: Magnesium: 2.1 mg/dL (ref 1.7–2.4)

## 2022-12-08 LAB — PHOSPHORUS: Phosphorus: 2.9 mg/dL (ref 2.5–4.6)

## 2022-12-08 LAB — PROCALCITONIN: Procalcitonin: 3.4 ng/mL

## 2022-12-08 MED ORDER — METOPROLOL TARTRATE 50 MG PO TABS
150.0000 mg | ORAL_TABLET | Freq: Two times a day (BID) | ORAL | Status: DC
  Administered 2022-12-08 – 2022-12-10 (×5): 150 mg via ORAL
  Filled 2022-12-08 (×5): qty 3

## 2022-12-08 MED ORDER — POTASSIUM CHLORIDE CRYS ER 20 MEQ PO TBCR
40.0000 meq | EXTENDED_RELEASE_TABLET | Freq: Once | ORAL | Status: AC
  Administered 2022-12-08: 40 meq via ORAL
  Filled 2022-12-08: qty 2

## 2022-12-08 NOTE — Plan of Care (Signed)
  Problem: Clinical Measurements: Goal: Cardiovascular complication will be avoided Outcome: Progressing   Problem: Metabolic: Goal: Ability to maintain appropriate glucose levels will improve Outcome: Progressing   Problem: Nutritional: Goal: Progress toward achieving an optimal weight will improve Outcome: Progressing

## 2022-12-08 NOTE — Progress Notes (Signed)
[facility] Progress Note  Patient: Lawrence Knight    ZOX:[id number]  DOA: 12/03/2022     Date of Service: the patient was seen and examined on 12/08/2022  Chief Complaint  Patient presents with   Fever   Dysuria   Flank Pain   Brief hospital course: Lawrence Knight is a 22 y.o. male with medical history significant of morbid obesity with BMI 71, asthma, who presents with dysuria, increased urinary frequency, right flank pain, fever and chills.   Patient states that his symptoms started yesterday, including right flank pain, dysuria, increased urinary frequency, fever and chills.  No burning on urination.  Patient has dry cough, denies SOB or chest pain.  He has nausea, no vomiting, diarrhea or abdominal pain.  Per report, patient had wheezing initially, which has resolved.  When I saw patient in ED, patient does not have wheezing on my examination.  ED Course: Leukocytosis WBC 29.2, potassium 3.3, GFR> 60, positive urinalysis (cloudy appearance, moderate amount leukocyte, positive nitrite, few bacteria, WBC> 50), negative PCR for COVID, flu and RSV, abnormal liver function (ALP 86, AST 25, ALT 55, total bilirubin 2.3), lactic acid 1.8.  Temperature while 3.1, blood pressure 175/95, 131/98, heart rate 150 --> 120-130s, RR 29, oxygen saturation 100% on room air.  Chest x-ray showed bronchitis change.  EKG: I have personally reviewed.  Sinus rhythm, QTc 415, mild T wave inversion in inferior leads and V4-V6.  CT-renal stone  Mild perinephric stranding around right kidney without hydronephrosis. This could be related to recently passed stone or pyelonephritis. Recommend clinical correlation.   Assessment and Plan:  Sepsis due to acute right pyelonephritis: Patient has sepsis with WBC 29.2, heart rate up to 150, RR up to 25-30s, temperature 102.3 0.1.  Lactic acid is normal, 1.8 --> 1.6.   CT per renal stone protocol is negative for hydronephrosis, but showed mild perinephric stranding  around right kidney, could be related to recent passage of stone or pyelonephritis.   Patient is at high risk of developing hypotension. Abnormal LFTs: Mild abnormal liver function, possibly due to sepsis. hepatitis panel negative S/p IV Rocephin (patient received 1 dose of vancomycin and cefepime in ED) 11/2 started cefazolin 2 g IV every 8 hourly -Blood culture, growing E. coli, pansensitive urine culture < 10k colonies insignificant growth -s/p IV fluid: 3 L LR, 900 cc/h -Continue Tylenol as needed -As needed Zofran for nausea 11/24 still WBC count elevated 17K, trending up US renal, negative for hydronephrosis, no fluid collection.  Limited sensitivity. If WBC count will not improve or any fever then patient may need repeat CT scan abd/p   Acute hypoxic respiratory failure most likely OSA, hypoventilation syndrome due to morbid obesity Continue supplemental O2 inhalation and gradually wean off D-dimer 3.6 elevated, CTA negative for PE BNP 65  # Hypertension, Started Lopressor 50 mg p.o. twice daily 11/22 increased Lopressor 100 mg p.o. twice daily 11/24 increased Lopressor 150 mg p.o. twice daily Monitor BP and titrate medications accordingly  # Sinus tachycardia most likely due to hypoventilation syndrome and OSA possible pulmonary hypertension TTE LVEF 55 to 60%, no any other significant findings.  Asthma:  -Bronchodilators and as needed Mucinex  AKI due to sepsis Creatinine 1.68--1.5---0.92 resolved S/p IV fluid for hydration, continue oral hydration Avoid nephrotoxic medications, use renally dose medications Monitor renal functions and urine output  Diarrhea, most likely noninfectious, resolved C. difficile and GI pathogen negative.  Continue Imodium as needed Started probiotics  Hypokalemia, potassium repleted.  Hypophosphatemia, Phos repleted.  Resolved Hypomagnesemia, mag repleted.  Resolved Monitor electrolytes and replete as needed Pharmacy consulted for  electrolyte monitoring and replacement  Iron deficiency anemia, transferrin saturation 8% Avoided IV iron due to active infection, started oral iron supplement Follow with PCP to repeat iron profile after 3 to 6 months  Vitamin D deficiency: started vitamin D 50,000 units p.o. weekly, follow with PCP to repeat vitamin D level after 3 to 6 months.  Vitamin B12 level 218, goal >400, started vitamin B12 1000 mcg IM injection during hospital stay followed by oral supplement.  Follow with PCP to repeat B12 level after 3 to 6 months.  Diabetes mellitus type 2, hemoglobin A1c 7.0, new diagnosis. Patient presented with hyperglycemia, no history of diabetes. Family history of diabetes on mother side 11/22 increased Semglee 15 units daily Started NovoLog sliding scale Monitor CBG, continue diabetic diet Diabetic coordinator consulted and nutritionist consulted for education and counseling   Abnormal thyroid function test, elevated free T4 TSH 1.37 within normal range Free T4 level 1.16, repeat free T4 level 1.01 wnl Follow repeat free T4 level and follow T3 level   Morbid obesity  Body mass index is 71.47 kg/m.  Interventions: Calorie restricted diet and daily exercise advised to lose body weight.  Lifestyle modification discussed.   Diet: Diabetic diet DVT Prophylaxis: Subcutaneous Heparin    Advance goals of care discussion: Full code  Family Communication: family was not present at bedside, at the time of interview.  The pt provided permission to discuss medical plan with the family. Opportunity was given to ask question and all questions were answered satisfactorily.   Disposition:  Pt is from Home, admitted with sepsis due to pyelo, still on Iv Abx, and high WBC count, which precludes a safe discharge. Discharge to home, most likely DC tomorrow a.m. if remains stable.    Subjective: No significant events overnight, breathing is getting better, no chest pain or palpitation.  No  fever or chills, no abdominal pain. Diarrhea almost resolved, had 1 loose stool today morning.  No nausea vomiting.  Physical Exam: General: NAD, lying comfortably Appear in no distress, affect appropriate Eyes: PERRLA ENT: Oral Mucosa Clear, moist  Neck: no JVD,  Cardiovascular: S1 and S2 Present, no Murmur,  Respiratory: Equal air entry bilaterally, mild by lateral crackles, no wheezing appreciated Abdomen: Bowel Sound present, Soft, obese and no tenderness,  Skin: no rashes Extremities: no Pedal edema, no calf tenderness Neurologic: without any new focal findings Gait not checked due to patient safety concerns  Vitals:   12/08/22 0606 12/08/22 0742 12/08/22 0909 12/08/22 1111  BP: (!) 181/114 (!) 174/89  (!) 153/111  Pulse: (!) 104 (!) 101 (!) 111 87  Resp:  18  16  Temp:  98.2 F (36.8 C)  98.3 F (36.8 C)  TempSrc:  Oral  Oral  SpO2:  95%  96%  Weight:      Height:        Intake/Output Summary (Last 24 hours) at 12/08/2022 1321 Last data filed at 12/08/2022 2130 Gross per 24 hour  Intake 880.11 ml  Output 1050 ml  Net -169.89 ml   Filed Weights   12/03/22 1315  Weight: (!) 239 kg    Data Reviewed: I have personally reviewed and interpreted daily labs, tele strips, imagings as discussed above. I reviewed all nursing notes, pharmacy notes, vitals, pertinent old records I have discussed plan of care as described above with RN and patient/family.  CBC: Recent  Labs  Lab 12/03/22 1322 12/04/22 0630 12/05/22 0402 12/06/22 0607 12/07/22 0904 12/08/22 0352  WBC 29.2* 18.6* 12.9* 13.2* 16.1* 17.7*  NEUTROABS 24.7*  --   --   --   --   --   HGB 13.3 11.4* 11.3* 10.9* 10.9* 10.6*  HCT 37.6* 31.9* 32.4* 31.0* 30.0* 30.0*  MCV 76.7* 76.9* 75.2* 75.2* 74.8* 75.2*  PLT 239 180 179 192 204 243   Basic Metabolic Panel: Recent Labs  Lab 12/04/22 0630 12/04/22 1752 12/05/22 0402 12/06/22 0607 12/07/22 0904 12/08/22 0352  NA 136 132* 132* 135 134* 135  K 3.4*  3.7 3.5 3.6 3.5 3.3*  CL 105 101 99 100 98 99  CO2 20* 22 22 25 26 26   GLUCOSE 241* 232* 190* 224* 232* 182*  BUN 18 19 18 16 16 15   CREATININE 1.68* 1.39* 1.51* 1.38* 1.07 0.92  CALCIUM 8.1* 8.0* 8.1* 8.4* 8.3* 8.4*  MG 1.5*  --  2.2 2.1 2.1 2.1  PHOS 1.2*  --  2.0* 2.3* 2.5 2.9    Studies: [place] [facility]  Result Date: 12/08/2022 CLINICAL DATA:  [id number] Pyelonephritis [id number] EXAM: ULTRASOUND RENAL LIMITED COMPARISON:  December 03, 2022 FINDINGS: Targeted ultrasound was performed of the RIGHT kidney and bladder. Evaluation is limited by body habitus. Right Kidney: Renal measurements: 14.3 x 7.2 x 7.9 cm = volume: 427 mL. Echogenicity within normal limits. No mass or hydronephrosis visualized. No focal drainable fluid collection is identified. Bladder: Unremarkable for degree of distension. Other: Increased hepatic echogenicity. IMPRESSION: 1. No hydronephrosis. No focal drainable fluid collection is identified. Please note ultrasound is of markedly limited sensitivity for pyelonephritis. 2. Increased hepatic echogenicity as can be seen in hepatic steatosis. Electronically Signed   By: Knight Klinefelter M.D.   On: 12/08/2022 10:12    Scheduled Meds:  cyanocobalamin  1,000 mcg Intramuscular Daily   Followed by   Melene Muller ON 12/11/2022] vitamin B-12  1,000 mcg Oral Daily   enoxaparin (LOVENOX) injection  120 mg Subcutaneous Q24H   fluticasone furoate-vilanterol  1 puff Inhalation Daily   insulin aspart  0-20 Units Subcutaneous TID WC   insulin glargine-yfgn  15 Units Subcutaneous QHS   iron polysaccharides  150 mg Oral Daily   losartan  25 mg Oral Daily   metoprolol tartrate  150 mg Oral BID   saccharomyces boulardii  250 mg Oral BID   Vitamin D (Ergocalciferol)  50,000 Units Oral Q7 days   Continuous Infusions:   ceFAZolin (ANCEF) IV 2 g (12/08/22 0514)   PRN Meds: acetaminophen, dextromethorphan-guaiFENesin, hydrALAZINE, HYDROmorphone, levalbuterol **AND** ipratropium,  loperamide, ondansetron (ZOFRAN) IV  Time spent: 40 minutes  Author: Gillis Knight. MD [facility] 12/08/2022 1:21 PM  To reach On-call, see care teams to locate the attending and reach out to them via [email or web address]. If 7PM-7AM, please contact night-coverage If you still have difficulty reaching the attending provider, please page the [facility] (Director on Call) for [facility] on amion for assistance.

## 2022-12-08 NOTE — Consult Note (Signed)
PHARMACY CONSULT NOTE - ELECTROLYTES  Pharmacy Consult for Electrolyte Monitoring and Replacement   Recent Labs: Height: 6' (182.9 cm) Weight: (!) 239 kg (527 lb) IBW/kg (Calculated) : 77.6 Estimated Creatinine Clearance: 249 mL/min (by C-G formula based on SCr of 0.92 mg/dL). Potassium (mmol/L)  Date Value  12/08/2022 3.3 (L)   Magnesium (mg/dL)  Date Value  11/91/4782 2.1   Calcium (mg/dL)  Date Value  95/62/1308 8.4 (L)   Albumin (g/dL)  Date Value  65/78/4696 2.3 (L)   Phosphorus (mg/dL)  Date Value  29/52/8413 2.9   Sodium (mmol/L)  Date Value  12/08/2022 135   Assessment  Lawrence Knight is a 22 y.o. male presenting with right flank pain and dysuria. PMH significant for morbid obesity, asthma. Patient admitted for sepsis secondary to pyelonephritis. Pharmacy has been consulted to monitor and replace electrolytes.  Diet: Medium Carb  MIVF:  Pertinent medications: N/A  Goal of Therapy: Electrolytes WNL   Plan:  K 3.3: Kcl x 1 Check BMP, Mg, Phos with AM labs  Thank you for allowing pharmacy to be a part of this patient's care.  Bettey Costa, PharmD Clinical Pharmacist 12/08/2022 8:06 AM

## 2022-12-08 NOTE — Progress Notes (Signed)
MEWS Progress Note  Patient Details Name: Maxxim Lashomb MRN: [id number] DOB: [birth year] Today's Date: 12/08/2022   MEWS Flowsheet Documentation:  Assess: MEWS Score Temp: 98.4 F (36.9 C) BP: (!) 189/117 MAP (mmHg): 135 Pulse Rate: 89 ECG Heart Rate: 88 Resp: (!) 22 Level of Consciousness: Alert SpO2: 95 % O2 Device: Nasal Cannula O2 Flow Rate (L/min): 2 L/min Assess: MEWS Score MEWS Temp: 0 MEWS Systolic: 0 MEWS Pulse: 0 MEWS RR: 1 MEWS LOC: 0 MEWS Score: 1 MEWS Score Color: Green Assess: SIRS CRITERIA SIRS Temperature : 0 SIRS Respirations : 1 SIRS Pulse: 0 SIRS WBC: 0 SIRS Score Sum : 1 SIRS Temperature : 0 SIRS Pulse: 0 SIRS Respirations : 1 SIRS WBC: 0 SIRS Score Sum : 1 Assess: if the MEWS score is Yellow or Red Were vital signs accurate and taken at a resting state?: Yes Does the patient meet 2 or more of the SIRS criteria?: Yes Does the patient have a confirmed or suspected source of infection?: Yes MEWS guidelines implemented : Yes, yellow Treat MEWS Interventions: Considered administering scheduled or prn medications/treatments as ordered Take Vital Signs Increase Vital Sign Frequency : Yellow: Q2hr x1, continue Q4hrs until patient remains green for 12hrs Escalate MEWS: Escalate: Yellow: Discuss with charge nurse and consider notifying provider and/or RRT Provider Notification Provider Name/Title: Manuela Schwartz, NP Date Provider Notified: 12/08/22 Time Provider Notified: 0107 Method of Notification: Page Notification Reason: Other (Comment) (Pt BP elevated 189/117. PRN hydralazine given twice but didn't work. Asymptomatic)      Kynnadi Dicenso Blanche C Kasi Lasky 12/08/2022, 1:16 AM

## 2022-12-09 DIAGNOSIS — N10 Acute tubulo-interstitial nephritis: Secondary | ICD-10-CM | POA: Diagnosis not present

## 2022-12-09 LAB — CBC
HCT: 30.4 % — ABNORMAL LOW (ref 39.0–52.0)
Hemoglobin: 10.7 g/dL — ABNORMAL LOW (ref 13.0–17.0)
MCH: 26.6 pg (ref 26.0–34.0)
MCHC: 35.2 g/dL (ref 30.0–36.0)
MCV: 75.6 fL — ABNORMAL LOW (ref 80.0–100.0)
Platelets: 314 10*3/uL (ref 150–400)
RBC: 4.02 MIL/uL — ABNORMAL LOW (ref 4.22–5.81)
RDW: 13.7 % (ref 11.5–15.5)
WBC: 17.1 10*3/uL — ABNORMAL HIGH (ref 4.0–10.5)
nRBC: 0.1 % (ref 0.0–0.2)

## 2022-12-09 LAB — BASIC METABOLIC PANEL
Anion gap: 9 (ref 5–15)
BUN: 14 mg/dL (ref 6–20)
CO2: 26 mmol/L (ref 22–32)
Calcium: 8.2 mg/dL — ABNORMAL LOW (ref 8.9–10.3)
Chloride: 97 mmol/L — ABNORMAL LOW (ref 98–111)
Creatinine, Ser: 0.93 mg/dL (ref 0.61–1.24)
GFR, Estimated: >60 mL/min (ref >60)
Glucose, Bld: 177 mg/dL — ABNORMAL HIGH (ref 70–99)
Potassium: 3.1 mmol/L — ABNORMAL LOW (ref 3.5–5.1)
Sodium: 132 mmol/L — ABNORMAL LOW (ref 135–145)

## 2022-12-09 LAB — GLUCOSE, CAPILLARY
Glucose-Capillary: 189 mg/dL — ABNORMAL HIGH (ref 70–99)
Glucose-Capillary: 166 mg/dL — ABNORMAL HIGH (ref 70–99)
Glucose-Capillary: 151 mg/dL — ABNORMAL HIGH (ref 70–99)
Glucose-Capillary: 132 mg/dL — ABNORMAL HIGH (ref 70–99)
Glucose-Capillary: 163 mg/dL — ABNORMAL HIGH (ref 70–99)

## 2022-12-09 LAB — HEPATIC FUNCTION PANEL
ALT: 51 U/L — ABNORMAL HIGH (ref 0–44)
AST: 42 U/L — ABNORMAL HIGH (ref 15–41)
Albumin: 2.4 g/dL — ABNORMAL LOW (ref 3.5–5.0)
Alkaline Phosphatase: 72 U/L (ref 38–126)
Bilirubin, Direct: 0.2 mg/dL (ref 0.0–0.2)
Indirect Bilirubin: 0.7 mg/dL (ref 0.3–0.9)
Total Bilirubin: 0.9 mg/dL (ref <1.2)
Total Protein: 6.8 g/dL (ref 6.5–8.1)

## 2022-12-09 LAB — PHOSPHORUS: Phosphorus: 3.7 mg/dL (ref 2.5–4.6)

## 2022-12-09 LAB — MAGNESIUM: Magnesium: 2 mg/dL (ref 1.7–2.4)

## 2022-12-09 MED ORDER — POTASSIUM CHLORIDE CRYS ER 20 MEQ PO TBCR
40.0000 meq | EXTENDED_RELEASE_TABLET | ORAL | Status: AC
  Administered 2022-12-09 (×3): 40 meq via ORAL
  Filled 2022-12-09 (×3): qty 2

## 2022-12-09 NOTE — Progress Notes (Signed)
SATURATION QUALIFICATIONS:  Patient Saturations on Room Air at Rest = 95%  Patient Saturations on Room Air while Ambulating = 86%  Patient Saturations on 2 Liters of oxygen while Ambulating = 90%  Patient required oxygen to maintain SpO2 levels greater than 88% while ambulating.

## 2022-12-09 NOTE — Progress Notes (Signed)
[facility] Progress Note  Patient: Lawrence Knight    NFA:[id number]  DOA: 12/03/2022     Date of Service: the patient was seen and examined on 12/09/2022  Chief Complaint  Patient presents with   Fever   Dysuria   Flank Pain   Brief hospital course: Christoval Heckmann is a 22 y.o. male with medical history significant of morbid obesity with BMI 71, asthma, who presents with dysuria, increased urinary frequency, right flank pain, fever and chills.   Patient states that his symptoms started yesterday, including right flank pain, dysuria, increased urinary frequency, fever and chills.  No burning on urination.  Patient has dry cough, denies SOB or chest pain.  He has nausea, no vomiting, diarrhea or abdominal pain.  Per report, patient had wheezing initially, which has resolved.  When I saw patient in ED, patient does not have wheezing on my examination.  ED Course: Leukocytosis WBC 29.2, potassium 3.3, GFR> 60, positive urinalysis (cloudy appearance, moderate amount leukocyte, positive nitrite, few bacteria, WBC> 50), negative PCR for COVID, flu and RSV, abnormal liver function (ALP 86, AST 25, ALT 55, total bilirubin 2.3), lactic acid 1.8.  Temperature while 3.1, blood pressure 175/95, 131/98, heart rate 150 --> 120-130s, RR 29, oxygen saturation 100% on room air.  Chest x-ray showed bronchitis change.  EKG: I have personally reviewed.  Sinus rhythm, QTc 415, mild T wave inversion in inferior leads and V4-V6.  CT-renal stone  Mild perinephric stranding around right kidney without hydronephrosis. This could be related to recently passed stone or pyelonephritis. Recommend clinical correlation.   Assessment and Plan:  # Sepsis due to acute right pyelonephritis: Patient has sepsis with WBC 29.2, heart rate up to 150, RR up to 25-30s, temperature 102.3 0.1.  Lactic acid is normal, 1.8 --> 1.6.   CT per renal stone protocol is negative for hydronephrosis, but showed mild perinephric stranding  around right kidney, could be related to recent passage of stone or pyelonephritis.   Patient is at high risk of developing hypotension. Abnormal LFTs: Mild abnormal liver function, possibly due to sepsis. hepatitis panel negative S/p IV Rocephin (patient received 1 dose of vancomycin and cefepime in ED) 11/2 started cefazolin 2 g IV every 8 hourly -Blood culture, growing E. coli, pansensitive urine culture < 10k colonies insignificant growth -s/p IV fluid: 3 L LR, 900 cc/h -Continue Tylenol as needed -As needed Zofran for nausea US renal, negative for hydronephrosis, no fluid collection.  Limited sensitivity. If WBC count will not improve or any fever then patient may need repeat CT scan abd/p 11/25 still WBC count elevated 17K, remained afebrile, will continue to monitor.   # Acute hypoxic respiratory failure most likely OSA, hypoventilation syndrome due to morbid obesity Continue supplemental O2 inhalation and gradually wean off D-dimer 3.6 elevated, CTA negative for PE BNP 65  # Hypertension, Started Lopressor 50 mg p.o. twice daily 11/22 increased Lopressor 100 mg p.o. twice daily 11/24 increased Lopressor 150 mg p.o. twice daily Monitor BP and titrate medications accordingly  # Sinus tachycardia most likely due to hypoventilation syndrome and OSA possible pulmonary hypertension TTE LVEF 55 to 60%, no any other significant findings.  # Asthma:  -Bronchodilators and as needed Mucinex  # AKI due to sepsis Creatinine 1.68--1.5---0.92 resolved S/p IV fluid for hydration, continue oral hydration Avoid nephrotoxic medications, use renally dose medications Monitor renal functions and urine output  # Diarrhea, most likely noninfectious,  C. difficile and GI pathogen negative.  Continue Imodium as  needed Started probiotics  # Hypokalemia, potassium repleted.   # Hypophosphatemia, Phos repleted.  Resolved # Hypomagnesemia, mag repleted.  Resolved Monitor electrolytes and  replete as needed Pharmacy consulted for electrolyte monitoring and replacement  # Iron deficiency anemia, transferrin saturation 8% Avoided IV iron due to active infection, started oral iron supplement Follow with PCP to repeat iron profile after 3 to 6 months  # Vitamin D deficiency: started vitamin D 50,000 units p.o. weekly, follow with PCP to repeat vitamin D level after 3 to 6 months.  # Vitamin B12 level 218, goal >400, started vitamin B12 1000 mcg IM injection during hospital stay followed by oral supplement.  Follow with PCP to repeat B12 level after 3 to 6 months.  # Diabetes mellitus type 2, hemoglobin A1c 7.0, new diagnosis. Patient presented with hyperglycemia, no history of diabetes. Family history of diabetes on mother side 11/22 increased Semglee 15 units daily Started NovoLog sliding scale Monitor CBG, continue diabetic diet Diabetic coordinator consulted and nutritionist consulted for education and counseling   # Abnormal thyroid function test, resolved elevated free T4 and TSH 1.37 within normal range Free T4 level 1.16, repeat free T4 level 1.01 wnl Follow repeat free T4 level and follow T3 level   Morbid obesity  Body mass index is 71.47 kg/m.  Interventions: Calorie restricted diet and daily exercise advised to lose body weight.  Lifestyle modification discussed.   Diet: Diabetic diet DVT Prophylaxis: Subcutaneous Heparin    Advance goals of care discussion: Full code  Family Communication: family was not present at bedside, at the time of interview.  The pt provided permission to discuss medical plan with the family. Opportunity was given to ask question and all questions were answered satisfactorily.   Disposition:  Pt is from Home, admitted with sepsis due to pyelo, still on Iv Abx, and high WBC count, which precludes a safe discharge. Discharge to home, most likely DC tomorrow a.m. if remains stable.    Subjective: No significant events overnight,  patient had loose stools, 2 episodes, small amount.  Denied any worsening of shortness of breath, feels better, no abdominal pain, no nasal complaints.  Physical Exam: General: NAD, lying comfortably Appear in no distress, affect appropriate Eyes: PERRLA ENT: Oral Mucosa Clear, moist  Neck: no JVD,  Cardiovascular: S1 and S2 Present, no Murmur,  Respiratory: Equal air entry bilaterally, mild by lateral crackles, no wheezing appreciated Abdomen: Bowel Sound present, Soft, obese and no tenderness,  Skin: no rashes Extremities: no Pedal edema, no calf tenderness Neurologic: without any new focal findings Gait not checked due to patient safety concerns  Vitals:   12/09/22 0324 12/09/22 0400 12/09/22 0834 12/09/22 1218  BP:  (!) 163/101 (!) 139/117 (!) 140/71  Pulse:  88 97 89  Resp:  19 18 18   Temp: 98.4 F (36.9 C)  98.6 F (37 C) 97.6 F (36.4 C)  TempSrc:   Oral Oral  SpO2:  94% 97% 90%  Weight:      Height:        Intake/Output Summary (Last 24 hours) at 12/09/2022 1429 Last data filed at 12/09/2022 0700 Gross per 24 hour  Intake 717.86 ml  Output 800 ml  Net -82.14 ml   Filed Weights   12/03/22 1315  Weight: (!) 239 kg    Data Reviewed: I have personally reviewed and interpreted daily labs, tele strips, imagings as discussed above. I reviewed all nursing notes, pharmacy notes, vitals, pertinent old records I have discussed  plan of care as described above with RN and patient/family.  CBC: Recent Labs  Lab 12/03/22 1322 12/04/22 0630 12/05/22 0402 12/06/22 0607 12/07/22 0904 12/08/22 0352 12/09/22 0453  WBC 29.2*   < > 12.9* 13.2* 16.1* 17.7* 17.1*  NEUTROABS 24.7*  --   --   --   --   --   --   HGB 13.3   < > 11.3* 10.9* 10.9* 10.6* 10.7*  HCT 37.6*   < > 32.4* 31.0* 30.0* 30.0* 30.4*  MCV 76.7*   < > 75.2* 75.2* 74.8* 75.2* 75.6*  PLT 239   < > 179 192 204 243 314   < > = values in this interval not displayed.   Basic Metabolic Panel: Recent Labs   Lab 12/05/22 0402 12/06/22 0607 12/07/22 0904 12/08/22 0352 12/09/22 0453  NA 132* 135 134* 135 132*  K 3.5 3.6 3.5 3.3* 3.1*  CL 99 100 98 99 97*  CO2 22 25 26 26 26   GLUCOSE 190* 224* 232* 182* 177*  BUN 18 16 16 15 14   CREATININE 1.51* 1.38* 1.07 0.92 0.93  CALCIUM 8.1* 8.4* 8.3* 8.4* 8.2*  MG 2.2 2.1 2.1 2.1 2.0  PHOS 2.0* 2.3* 2.5 2.9 3.7    Studies: No results found.  Scheduled Meds:  cyanocobalamin  1,000 mcg Intramuscular Daily   Followed by   Melene Muller ON 12/11/2022] vitamin B-12  1,000 mcg Oral Daily   enoxaparin (LOVENOX) injection  120 mg Subcutaneous Q24H   fluticasone furoate-vilanterol  1 puff Inhalation Daily   insulin aspart  0-20 Units Subcutaneous TID WC   insulin glargine-yfgn  15 Units Subcutaneous QHS   iron polysaccharides  150 mg Oral Daily   losartan  25 mg Oral Daily   metoprolol tartrate  150 mg Oral BID   potassium chloride  40 mEq Oral Q4H   saccharomyces boulardii  250 mg Oral BID   Vitamin D (Ergocalciferol)  50,000 Units Oral Q7 days   Continuous Infusions:   ceFAZolin (ANCEF) IV 2 g (12/09/22 0648)   PRN Meds: acetaminophen, dextromethorphan-guaiFENesin, hydrALAZINE, HYDROmorphone, levalbuterol **AND** ipratropium, loperamide, ondansetron (ZOFRAN) IV  Time spent: 40 minutes  Author: Gillis Santa. MD [facility] 12/09/2022 2:30 PM  To reach On-call, see care teams to locate the attending and reach out to them via [email or web address]. If 7PM-7AM, please contact night-coverage If you still have difficulty reaching the attending provider, please page the [facility] (Director on Call) for [facility] on amion for assistance.

## 2022-12-09 NOTE — Plan of Care (Signed)
  Problem: Clinical Measurements: Goal: Respiratory complications will improve Outcome: Progressing   Problem: Nutrition: Goal: Adequate nutrition will be maintained Outcome: Progressing   Problem: Safety: Goal: Ability to remain free from injury will improve Outcome: Progressing   Problem: Metabolic: Goal: Ability to maintain appropriate glucose levels will improve Outcome: Progressing   Problem: Nutritional: Goal: Progress toward achieving an optimal weight will improve Outcome: Progressing

## 2022-12-09 NOTE — Consult Note (Signed)
PHARMACY CONSULT NOTE - ELECTROLYTES  Pharmacy Consult for Electrolyte Monitoring and Replacement   Recent Labs: Height: 6' (182.9 cm) Weight: (!) 239 kg (527 lb) IBW/kg (Calculated) : 77.6 Estimated Creatinine Clearance: 246.3 mL/min (by C-G formula based on SCr of 0.93 mg/dL). Potassium (mmol/L)  Date Value  12/09/2022 3.1 (L)   Magnesium (mg/dL)  Date Value  53/66/4403 2.0   Calcium (mg/dL)  Date Value  47/42/5956 8.2 (L)   Albumin (g/dL)  Date Value  38/75/6433 2.4 (L)   Phosphorus (mg/dL)  Date Value  29/51/8841 3.7   Sodium (mmol/L)  Date Value  12/09/2022 132 (L)   Assessment  Lawrence Knight is a 22 y.o. male presenting with right flank pain and dysuria. PMH significant for morbid obesity, asthma. Patient admitted for sepsis secondary to pyelonephritis. Pharmacy has been consulted to monitor and replace electrolytes.  Diet: Medium Carb  MIVF: none.  Pertinent medications: losartan 25 mg daily.   Goal of Therapy: Electrolytes WNL   Plan:  Medical team ordered Kcl 40 mEq q4H x 3 doses  F/u with AM labs.   Thank you for allowing pharmacy to be a part of this patient's care.  Ronnald Ramp, PharmD Clinical Pharmacist 12/09/2022 12:22 PM

## 2022-12-10 ENCOUNTER — Other Ambulatory Visit: Payer: Self-pay

## 2022-12-10 ENCOUNTER — Other Ambulatory Visit (HOSPITAL_COMMUNITY): Payer: Self-pay

## 2022-12-10 DIAGNOSIS — N10 Acute tubulo-interstitial nephritis: Secondary | ICD-10-CM | POA: Diagnosis not present

## 2022-12-10 LAB — HEPATIC FUNCTION PANEL
ALT: 52 U/L — ABNORMAL HIGH (ref 0–44)
AST: 38 U/L (ref 15–41)
Albumin: 2.5 g/dL — ABNORMAL LOW (ref 3.5–5.0)
Alkaline Phosphatase: 70 U/L (ref 38–126)
Bilirubin, Direct: 0.2 mg/dL (ref 0.0–0.2)
Indirect Bilirubin: 0.7 mg/dL (ref 0.3–0.9)
Total Bilirubin: 0.9 mg/dL (ref <1.2)
Total Protein: 6.8 g/dL (ref 6.5–8.1)

## 2022-12-10 LAB — CBC
HCT: 30.2 % — ABNORMAL LOW (ref 39.0–52.0)
Hemoglobin: 10.5 g/dL — ABNORMAL LOW (ref 13.0–17.0)
MCH: 26.5 pg (ref 26.0–34.0)
MCHC: 34.8 g/dL (ref 30.0–36.0)
MCV: 76.3 fL — ABNORMAL LOW (ref 80.0–100.0)
Platelets: 359 10*3/uL (ref 150–400)
RBC: 3.96 MIL/uL — ABNORMAL LOW (ref 4.22–5.81)
RDW: 13.7 % (ref 11.5–15.5)
WBC: 16.2 10*3/uL — ABNORMAL HIGH (ref 4.0–10.5)
nRBC: 0 % (ref 0.0–0.2)

## 2022-12-10 LAB — BASIC METABOLIC PANEL
Anion gap: 7 (ref 5–15)
BUN: 11 mg/dL (ref 6–20)
CO2: 27 mmol/L (ref 22–32)
Calcium: 8.4 mg/dL — ABNORMAL LOW (ref 8.9–10.3)
Chloride: 103 mmol/L (ref 98–111)
Creatinine, Ser: 0.83 mg/dL (ref 0.61–1.24)
GFR, Estimated: >60 mL/min (ref >60)
Glucose, Bld: 158 mg/dL — ABNORMAL HIGH (ref 70–99)
Potassium: 3.9 mmol/L (ref 3.5–5.1)
Sodium: 137 mmol/L (ref 135–145)

## 2022-12-10 LAB — PHOSPHORUS: Phosphorus: 3.8 mg/dL (ref 2.5–4.6)

## 2022-12-10 LAB — GLUCOSE, CAPILLARY
Glucose-Capillary: 165 mg/dL — ABNORMAL HIGH (ref 70–99)
Glucose-Capillary: 155 mg/dL — ABNORMAL HIGH (ref 70–99)

## 2022-12-10 LAB — HEPARIN ANTI-XA: Heparin LMW: 0.6 [IU]/mL

## 2022-12-10 LAB — MAGNESIUM: Magnesium: 2 mg/dL (ref 1.7–2.4)

## 2022-12-10 MED ORDER — CYANOCOBALAMIN 1000 MCG PO TABS
1000.0000 ug | ORAL_TABLET | Freq: Every day | ORAL | 2 refills | Status: AC
  Filled 2022-12-10: qty 30, 30d supply, fill #0

## 2022-12-10 MED ORDER — CIPROFLOXACIN HCL 250 MG PO TABS
750.0000 mg | ORAL_TABLET | Freq: Two times a day (BID) | ORAL | 0 refills | Status: AC
  Filled 2022-12-10: qty 7, 1d supply, fill #0
  Filled 2022-12-10: qty 23, 4d supply, fill #0

## 2022-12-10 MED ORDER — CIPROFLOXACIN HCL 250 MG PO TABS
750.0000 mg | ORAL_TABLET | Freq: Two times a day (BID) | ORAL | Status: DC
  Administered 2022-12-10: 750 mg via ORAL
  Filled 2022-12-10 (×2): qty 3

## 2022-12-10 MED ORDER — VITAMIN D (ERGOCALCIFEROL) 1.25 MG (50000 UNIT) PO CAPS
50000.0000 [IU] | ORAL_CAPSULE | ORAL | 0 refills | Status: AC
  Filled 2022-12-10: qty 12, 84d supply, fill #0

## 2022-12-10 MED ORDER — POLYSACCHARIDE IRON COMPLEX 150 MG PO CAPS
150.0000 mg | ORAL_CAPSULE | Freq: Every day | ORAL | 2 refills | Status: DC
  Filled 2022-12-10: qty 30, 30d supply, fill #0

## 2022-12-10 MED ORDER — METFORMIN HCL ER 500 MG PO TB24
500.0000 mg | ORAL_TABLET | Freq: Two times a day (BID) | ORAL | 11 refills | Status: DC
  Filled 2022-12-10: qty 60, 30d supply, fill #0

## 2022-12-10 MED ORDER — LOSARTAN POTASSIUM 25 MG PO TABS
25.0000 mg | ORAL_TABLET | Freq: Every day | ORAL | 11 refills | Status: DC
  Filled 2022-12-10: qty 30, 30d supply, fill #0

## 2022-12-10 MED ORDER — LOSARTAN POTASSIUM 50 MG PO TABS
50.0000 mg | ORAL_TABLET | Freq: Every day | ORAL | 11 refills | Status: DC
  Filled 2022-12-10: qty 30, 30d supply, fill #0

## 2022-12-10 MED ORDER — ENOXAPARIN SODIUM 120 MG/0.8ML IJ SOSY
110.0000 mg | PREFILLED_SYRINGE | INTRAMUSCULAR | Status: DC
Start: 2022-12-10 — End: 2022-12-10

## 2022-12-10 MED ORDER — METOPROLOL TARTRATE 100 MG PO TABS
150.0000 mg | ORAL_TABLET | Freq: Two times a day (BID) | ORAL | 11 refills | Status: DC
  Filled 2022-12-10: qty 90, 30d supply, fill #0

## 2022-12-10 NOTE — Discharge Summary (Signed)
[facility] Discharge Summary   Patient: Lawrence Knight ZOX:[id number]  PCP: Pcp, No  Date of admission: 12/03/2022   Date of discharge: 12/10/2022     Discharge Diagnoses:  Principal Problem:   Acute pyelonephritis Active Problems:   Sepsis (HCC)   Abnormal LFTs   Morbid obesity with BMI of 70 and over, adult (HCC)   Asthma   Admitted From: Home Disposition:  Home   Recommendations for Outpatient Follow-up:  Follow-up with PCP in 1 week, continue to monitor BP at home and follow with PCP to titrate medications accordingly. Continue diabetic diet, started metformin 500 mg p.o. twice daily, recheck hemoglobin A1c and titrate dose accordingly. Repeat iron profile, vitamin B12 and vitamin D level after 3 to 6 months Follow with pulmonary for sleep study and PFTs as an outpatient in 1 to 2 weeks Follow up LABS/TEST:  CBC in 1 wk   Follow-up Information     PCP Follow up in 1 week(s).          Assaker, [place], MD Follow up in 1 week(s).   Specialty: Pulmonary Disease Why: For sleep study and PFTs Contact information: [address] [place] [address] [phone]                Diet recommendation: Cardiac and Carb modified diet  Activity: The patient is advised to gradually reintroduce usual activities, as tolerated  Discharge Condition: stable  Code Status: Full code   History of present illness: As per the H and P dictated on admission Hospital Course:  Lawrence Knight is a 22 y.o. male with medical history significant of morbid obesity with BMI 71, asthma, who presents with dysuria, increased urinary frequency, right flank pain, fever and chills.   Patient states that his symptoms started yesterday, including right flank pain, dysuria, increased urinary frequency, fever and chills.  No burning on urination.  Patient has dry cough, denies SOB or chest pain.  He has nausea, no vomiting, diarrhea or abdominal pain.  Per report, patient had  wheezing initially, which has resolved.  When I saw patient in ED, patient does not have wheezing on my examination.   ED Course: Leukocytosis WBC 29.2, potassium 3.3, GFR> 60, positive urinalysis (cloudy appearance, moderate amount leukocyte, positive nitrite, few bacteria, WBC> 50), negative PCR for COVID, flu and RSV, abnormal liver function (ALP 86, AST 25, ALT 55, total bilirubin 2.3), lactic acid 1.8.  Temperature while 3.1, blood pressure 175/95, 131/98, heart rate 150 --> 120-130s, RR 29, oxygen saturation 100% on room air.  Chest x-ray showed bronchitis change.  EKG: I have personally reviewed.  Sinus rhythm, QTc 415, mild T wave inversion in inferior leads and V4-V6.  CT-renal stone  Mild perinephric stranding around right kidney without hydronephrosis. This could be related to recently passed stone or pyelonephritis. Recommend clinical correlation.   Assessment and Plan:   # Sepsis due to acute right pyelonephritis: Patient has sepsis with WBC 29.2, heart rate up to 150, RR up to 25-30s, temperature 102.3 0.1.  Lactic acid is normal, 1.8 --> 1.6.   CT per renal stone protocol is negative for hydronephrosis, but showed mild perinephric stranding around right kidney, could be related to recent passage of stone or pyelonephritis.  Patient is at high risk of developing hypotension. Abnormal LFTs: Mild abnormal liver function, possibly due to sepsis. hepatitis panel negative S/p IV Rocephin (patient received 1 dose of vancomycin and cefepime in ED) 11/2 started cefazolin 2 g IV every  8 hourly, Blood culture, growing E. coli, pansensitive. urine culture < 10k colonies insignificant growth. s/p IV fluid: 3 L LR, 900 cc/h. Continue Tylenol as needed. Zofran prn for nausea US renal, negative for hydronephrosis, no fluid collection.  Limited sensitivity. If WBC count will not improve or any fever then patient may need repeat CT scan abd/p 11/25 still WBC count elevated 17K, remained afebrile, will  continue to monitor. Today patient remained afebrile, WBC count slightly improved.  Transition to oral antibiotics ciprofloxacin 750 mg p.o. twice daily for 5 days.  Recommend to follow with PCP in 1 to 2 weeks  # Acute hypoxic respiratory failure most likely OSA, hypoventilation syndrome due to morbid obesity Continue supplemental O2 inhalation and gradually wean off D-dimer 3.6 elevated, CTA negative for PE. BNP 65 Patient was discharged on supplemental O2 relation, recommend to follow with PCP and pulmonary as an outpatient   # Hypertension: Started Lopressor 50 mg p.o. twice daily 11/22 increased Lopressor 100 mg p.o. twice daily 11/24 increased Lopressor 150 mg p.o. twice daily Patient was discharged on Lopressor 150 mg p.o. twice daily and losartan 50 mg p.o. daily.  Patient was advised to monitor BP at home and follow with PCP to titrate medications accordingly.   # Sinus tachycardia most likely due to hypoventilation syndrome and OSA possible pulmonary hypertension TTE LVEF 55 to 60%, no any other significant findings. # Asthma:  Bronchodilators and as needed Mucinex # AKI due to sepsis: Creatinine 1.68--1.5---0.92 resolved S/p IV fluid for hydration, continue oral hydration # Diarrhea, most likely noninfectious, C. difficile and GI pathogen negative.  S/p Imodium as needed and probiotics.  No more needs. # Hypokalemia, potassium repleted.  Resolved # Hypophosphatemia, Phos repleted.  Resolved # Hypomagnesemia, mag repleted.  Resolved # Iron deficiency anemia, transferrin saturation 8, Avoided IV iron due to active infection, started oral iron supplement Follow with PCP to repeat iron profile after 3 to 6 months # Vitamin D deficiency: started vitamin D 50,000 units p.o. weekly, follow with PCP to repeat vitamin D level after 3 to 6 months. # Vitamin B12 level 218, goal >400, started vitamin B12 1000 mcg IM injection during hospital stay followed by oral supplement.  Follow with PCP  to repeat B12 level after 3 to 6 months. # Diabetes mellitus type 2, hemoglobin A1c 7.0, new diagnosis. Patient presented with hyperglycemia, no history of diabetes. Family history of diabetes on mother side. S/p Semglee 15 units daily and NovoLog sliding scale.  Patient was discharged on metformin 500 mg p.o. twice daily, recommended to continue diabetic diet, monitor CBG and follow with PCP for further management. # Abnormal thyroid function test, resolved elevated free T4 and TSH 1.37 within normal range Free T4 level 1.16, repeat free T4 level 1.01 wnl # Morbid obesity  Body mass index is 71.47 kg/m.  Interventions: Calorie restricted diet and daily exercise advised to lose body weight.  Lifestyle modification discussed.  Patient was ambulatory without any assistance. On the day of the discharge the patient's vitals were stable, and no other acute medical condition were reported by patient. the patient was felt safe to be discharge at Home.  Consultants: None Procedures: None  Discharge Exam: General: Appear in no distress, no Rash; Oral Mucosa Clear, moist. Cardiovascular: S1 and S2 Present, no Murmur, Respiratory: normal respiratory effort, Bilateral Air entry present and no Crackles, no wheezes Abdomen: Bowel Sound present, Soft and no tenderness, no hernia Extremities: no Pedal edema, no calf tenderness Neurology: alert  and oriented to time, place, and person affect appropriate.  Filed Weights   12/03/22 1315  Weight: (!) 239 kg   Vitals:   12/10/22 0500 12/10/22 0746  BP: (!) 172/93   Pulse: 91   Resp: 18 20  Temp: 98.3 F (36.8 C) 99 F (37.2 C)  SpO2: 97%     DISCHARGE MEDICATION: Allergies as of 12/10/2022   No Known Allergies      Medication List     TAKE these medications    ciprofloxacin 750 MG tablet Commonly known as: CIPRO Take 1 tablet (750 mg total) by mouth 2 (two) times daily for 5 days.   cyanocobalamin 1000 MCG tablet Take 1 tablet  (1,000 mcg total) by mouth daily. Start taking on: December 11, 2022   iron polysaccharides 150 MG capsule Commonly known as: NIFEREX Take 1 capsule (150 mg total) by mouth daily. Start taking on: December 11, 2022   losartan 50 MG tablet Commonly known as: COZAAR Take 1 tablet (50 mg total) by mouth daily. Start taking on: December 11, 2022   metformin 500 MG (OSM) 24 hr tablet Commonly known as: FORTAMET Take 1 tablet (500 mg total) by mouth 2 (two) times daily with a meal.   Metoprolol Tartrate 75 MG Tabs Take 2 tablets (150 mg total) by mouth 2 (two) times daily.   Vitamin D (Ergocalciferol) 1.25 MG (50000 UNIT) Caps capsule Commonly known as: DRISDOL Take 1 capsule (50,000 Units total) by mouth every 7 (seven) days. Start taking on: December 11, 2022               Durable Medical Equipment  (From admission, onward)           Start     Ordered   12/09/22 1549  For home use only DME oxygen  Once       Question Answer Comment  Length of Need 12 Months   Mode or (Route) Nasal cannula   Liters per Minute 2   Frequency Continuous (stationary and portable oxygen unit needed)   Oxygen conserving device Yes   Oxygen delivery system Gas      12/09/22 1548           No Known Allergies Discharge Instructions     Amb Referral to Nutrition and Diabetic Education   Complete by: As directed    Call MD for:  difficulty breathing, headache or visual disturbances   Complete by: As directed    Call MD for:  extreme fatigue   Complete by: As directed    Call MD for:  persistant dizziness or light-headedness   Complete by: As directed    Call MD for:  persistant nausea and vomiting   Complete by: As directed    Call MD for:  severe uncontrolled pain   Complete by: As directed    Call MD for:  temperature >100.4   Complete by: As directed    Diet - low sodium heart healthy   Complete by: As directed    Discharge instructions   Complete by: As directed     Follow-up with PCP in 1 week, continue to monitor BP at home and follow with PCP to titrate medications accordingly.  Repeat CBC in 1 week to check WBC count Continue diabetic diet, started metformin 500 mg p.o. twice daily, recheck hemoglobin A1c and titrate dose accordingly. Repeat iron profile, vitamin B12 and vitamin D level after 3 to 6 months Follow with pulmonary for sleep study and  PFTs as an outpatient in 1 to 2 weeks   Increase activity slowly   Complete by: As directed        The results of significant diagnostics from this hospitalization (including imaging, microbiology, ancillary and laboratory) are listed below for reference.    Significant Diagnostic Studies: [employer]  Result Date: 12/08/2022 CLINICAL DATA:  [id number] Pyelonephritis [id number] EXAM: ULTRASOUND RENAL LIMITED COMPARISON:  December 03, 2022 FINDINGS: Targeted ultrasound was performed of the RIGHT kidney and bladder. Evaluation is limited by body habitus. Right Kidney: Renal measurements: 14.3 x 7.2 x 7.9 cm = volume: 427 mL. Echogenicity within normal limits. No mass or hydronephrosis visualized. No focal drainable fluid collection is identified. Bladder: Unremarkable for degree of distension. Other: Increased hepatic echogenicity. IMPRESSION: 1. No hydronephrosis. No focal drainable fluid collection is identified. Please note ultrasound is of markedly limited sensitivity for pyelonephritis. 2. Increased hepatic echogenicity as can be seen in hepatic steatosis. Electronically Signed   By: Meda Klinefelter M.D.   On: 12/08/2022 10:12   ECHOCARDIOGRAM COMPLETE  Result Date: 12/06/2022    ECHOCARDIOGRAM REPORT   Patient Name:   Lawrence Knight Date of Exam: 12/06/2022 Medical Rec #:  [id number]   Height:       72.0 in Accession #:    [id number]  Weight:       527.0 lb Date of Birth:  01-15-2000   BSA:          3.216 m Patient Age:    22 years    BP:           167/107 mmHg Patient Gender: M           HR:            115 bpm. Exam Location:  [facility] Procedure: 2D Echo, Cardiac Doppler, Color Doppler and Intracardiac            Opacification Agent Indications:     Abnormal ECG  History:         Patient has no prior history of Echocardiogram examinations.                  Abnormal ECG. TDS, Patient is mostly sitting throughout the                  study.  Sonographer:     Mikki Harbor Referring Phys:  [id number] Gillis Santa Diagnosing Phys: Debbe Odea MD  Sonographer Comments: Technically difficult study due to poor echo windows and patient is obese. IMPRESSIONS  1. Left ventricular ejection fraction, by estimation, is 55 to 60%. The left ventricle has normal function. The left ventricle has no regional wall motion abnormalities. There is mild left ventricular hypertrophy. Left ventricular diastolic function could not be evaluated.  2. Right ventricular systolic function is normal. The right ventricular size is normal.  3. The mitral valve is normal in structure. No evidence of mitral valve regurgitation.  4. The aortic valve is grossly normal. Aortic valve regurgitation is not visualized. FINDINGS  Left Ventricle: Left ventricular ejection fraction, by estimation, is 55 to 60%. The left ventricle has normal function. The left ventricle has no regional wall motion abnormalities. Definity contrast agent was given IV to delineate the left ventricular  endocardial borders. The left ventricular internal cavity size was normal in size. There is mild left ventricular hypertrophy. Left ventricular diastolic function could not be evaluated. Right Ventricle: The right ventricular size is normal. No increase in right ventricular wall thickness. Right  ventricular systolic function is normal. Left Atrium: Left atrial size was normal in size. Right Atrium: Right atrial size was not well visualized. Pericardium: There is no evidence of pericardial effusion. Mitral Valve: The mitral valve is normal in structure. No evidence of mitral valve  regurgitation. MV peak gradient, 4.2 mmHg. The mean mitral valve gradient is 2.0 mmHg. Tricuspid Valve: The tricuspid valve is normal in structure. Tricuspid valve regurgitation is not demonstrated. Aortic Valve: The aortic valve is grossly normal. Aortic valve regurgitation is not visualized. Aortic valve mean gradient measures 4.0 mmHg. Aortic valve peak gradient measures 9.6 mmHg. Aortic valve area, by VTI measures 3.12 cm. Pulmonic Valve: The pulmonic valve was normal in structure. Pulmonic valve regurgitation is not visualized. Aorta: The aortic root is normal in size and structure. IAS/Shunts: No atrial level shunt detected by color flow Doppler.  LEFT VENTRICLE PLAX 2D LVIDd:         4.50 cm LVIDs:         3.20 cm LV PW:         1.60 cm LV IVS:        1.70 cm LVOT diam:     2.40 cm LV SV:         69 LV SV Index:   22 LVOT Area:     4.52 cm  LEFT ATRIUM         Index LA diam:    3.60 cm 1.12 cm/m  AORTIC VALVE                    PULMONIC VALVE AV Area (Vmax):    3.06 cm     PV Vmax:       1.44 m/s AV Area (Vmean):   3.17 cm     PV Peak grad:  8.3 mmHg AV Area (VTI):     3.12 cm AV Vmax:           155.00 cm/s AV Vmean:          85.600 cm/s AV VTI:            0.222 m AV Peak Grad:      9.6 mmHg AV Mean Grad:      4.0 mmHg LVOT Vmax:         105.00 cm/s LVOT Vmean:        59.900 cm/s LVOT VTI:          0.153 m LVOT/AV VTI ratio: 0.69  AORTA Ao Root diam: 3.40 cm MITRAL VALVE MV Area (PHT): 3.14 cm   SHUNTS MV Area VTI:   4.49 cm   Systemic VTI:  0.15 m MV Peak grad:  4.2 mmHg   Systemic Diam: 2.40 cm MV Mean grad:  2.0 mmHg MV Vmax:       1.02 m/s MV Vmean:      62.2 cm/s Debbe Odea MD Electronically signed by Debbe Odea MD Signature Date/Time: 12/06/2022/1:11:23 PM    Final    CT Angio Chest Pulmonary Embolism (PE) W or WO Contrast  Result Date: 12/04/2022 CLINICAL DATA:  Right-sided pain with fever and chills, initial encounter EXAM: CT ANGIOGRAPHY CHEST WITH CONTRAST TECHNIQUE:  Multidetector CT imaging of the chest was performed using the standard protocol during bolus administration of intravenous contrast. Multiplanar CT image reconstructions and MIPs were obtained to evaluate the vascular anatomy. RADIATION DOSE REDUCTION: This exam was performed according to the departmental dose-optimization program which includes automated exposure control, adjustment of the mA and/or kV  according to patient size and/or use of iterative reconstruction technique. CONTRAST:  OMNIPAQUE IOHEXOL 350 MG/ML SOLN COMPARISON:  Chest x-ray from the previous day. FINDINGS: Cardiovascular: Thoracic aorta and its branches are within normal limits. No cardiac enlargement is seen. The pulmonary artery shows a normal branching pattern bilaterally. No pulmonary emboli are seen. Mediastinum/Nodes: Thoracic inlet is within normal limits. No hilar or mediastinal adenopathy is noted. The esophagus as visualized is within normal limits. Lungs/Pleura: Lungs are well aerated bilaterally. No focal infiltrate or sizable effusion is seen. Bibasilar atelectatic changes are noted. A few calcified granulomas are seen as well. Upper Abdomen: Fatty infiltration of the liver is noted. The remainder of the upper abdomen is within normal limits. Musculoskeletal: No chest wall abnormality. No acute or significant osseous findings. Review of the MIP images confirms the above findings. IMPRESSION: No evidence of pulmonary emboli. Bibasilar atelectasis. Changes of prior granulomatous disease. Electronically Signed   By: Alcide Clever M.D.   On: 12/04/2022 22:20   CT Renal Stone Study  Result Date: 12/03/2022 CLINICAL DATA:  Right flank pain EXAM: CT ABDOMEN AND PELVIS WITHOUT CONTRAST TECHNIQUE: Multidetector CT imaging of the abdomen and pelvis was performed following the standard protocol without IV contrast. RADIATION DOSE REDUCTION: This exam was performed according to the departmental dose-optimization program which  includes automated exposure control, adjustment of the mA and/or kV according to patient size and/or use of iterative reconstruction technique. COMPARISON:  None Available. FINDINGS: Lower chest: No acute abnormality Hepatobiliary: Diffuse low-density throughout the liver compatible with fatty infiltration. No focal abnormality. Gallbladder not visualized and could be contracted or absent. Pancreas: No focal abnormality or ductal dilatation. Spleen: No focal abnormality.  Normal size. Adrenals/Urinary Tract: Adrenal glands normal. Perinephric stranding around the right kidney without visible stones or hydronephrosis. Left kidney unremarkable. Urinary bladder unremarkable. Stomach/Bowel: Stomach, large and small bowel grossly unremarkable. Appendix not definitively seen. Vascular/Lymphatic: No evidence of aneurysm or adenopathy. Reproductive: No visible focal abnormality. Other: No free fluid or free air. Musculoskeletal: No acute bony abnormality. IMPRESSION: Mild perinephric stranding around right kidney without hydronephrosis. This could be related to recently passed stone or pyelonephritis. Recommend clinical correlation. Fatty liver. Electronically Signed   By: Charlett Nose M.D.   On: 12/03/2022 19:45   DG Chest Port 1 View  Result Date: 12/03/2022 CLINICAL DATA:  Questionable sepsis - evaluate for abnormality EXAM: PORTABLE CHEST 1 VIEW COMPARISON:  None Available. FINDINGS: Heart size upper normal. Mediastinal contours are normal. Suspected bronchial thickening without focal airspace disease. No large pleural effusion. No pneumothorax. Soft tissue attenuation from habitus limits assessment. IMPRESSION: Suspected bronchial thickening without focal airspace disease. Electronically Signed   By: Narda Rutherford M.D.   On: 12/03/2022 17:45    Microbiology: Recent Results (from the past 240 hour(s))  Blood Culture (routine x 2)     Status: Abnormal   Collection Time: 12/03/22  1:21 PM   Specimen: BLOOD   Result Value Ref Range Status   Specimen Description   Final    BLOOD RIGHT HAND Performed at [facility], [address], [place], [place] [address]    Special Requests   Final    BOTTLES DRAWN AEROBIC AND ANAEROBIC Performed at [facility], [address] [place], [place] [address]    Culture  Setup Time   Final    Organism ID to follow GRAM NEGATIVE RODS IN BOTH AEROBIC AND ANAEROBIC BOTTLES CRITICAL RESULT CALLED TO, READ BACK BY AND VERIFIED WITH:  Princella Ion @0234  on 12/04/22 skl Performed at [facility], [address] [place], [place] [address]    Culture ESCHERICHIA COLI (A)  Final   Report Status 12/06/2022 FINAL  Final   Organism ID, Bacteria ESCHERICHIA COLI  Final   Organism ID, Bacteria ESCHERICHIA COLI  Final      Susceptibility   Escherichia coli - KIRBY BAUER*    CEFAZOLIN SENSITIVE Sensitive    Escherichia coli - MIC*    AMPICILLIN 4 SENSITIVE Sensitive     CEFEPIME <=0.12 SENSITIVE Sensitive     CEFTAZIDIME <=1 SENSITIVE Sensitive     CEFTRIAXONE <=0.25 SENSITIVE Sensitive     CIPROFLOXACIN <=0.25 SENSITIVE Sensitive     GENTAMICIN <=1 SENSITIVE Sensitive     IMIPENEM <=0.25 SENSITIVE Sensitive     TRIMETH/SULFA <=20 SENSITIVE Sensitive     AMPICILLIN/SULBACTAM <=2 SENSITIVE Sensitive     PIP/TAZO <=4 SENSITIVE Sensitive ug/mL    * ESCHERICHIA COLI    ESCHERICHIA COLI  Blood Culture ID Panel (Reflexed)     Status: Abnormal   Collection Time: 12/03/22  1:21 PM  Result Value Ref Range Status   Enterococcus faecalis NOT DETECTED NOT DETECTED Final   Enterococcus Faecium NOT DETECTED NOT DETECTED Final   Listeria monocytogenes NOT DETECTED NOT DETECTED Final   Staphylococcus species NOT DETECTED NOT DETECTED Final   Staphylococcus aureus (BCID) NOT DETECTED NOT DETECTED Final   Staphylococcus epidermidis NOT DETECTED NOT DETECTED Final   Staphylococcus lugdunensis NOT DETECTED NOT DETECTED Final   Streptococcus  species NOT DETECTED NOT DETECTED Final   Streptococcus agalactiae NOT DETECTED NOT DETECTED Final   Streptococcus pneumoniae NOT DETECTED NOT DETECTED Final   Streptococcus pyogenes NOT DETECTED NOT DETECTED Final   A.calcoaceticus-baumannii NOT DETECTED NOT DETECTED Final   Bacteroides fragilis NOT DETECTED NOT DETECTED Final   Enterobacterales DETECTED (A) NOT DETECTED Final    Comment: Enterobacterales represent a large order of gram negative bacteria, not a single organism. CRITICAL RESULT CALLED TO, READ BACK BY AND VERIFIED WITH: Princella Ion @0234  on 12/04/22 skl    Enterobacter cloacae complex NOT DETECTED NOT DETECTED Final   Escherichia coli DETECTED (A) NOT DETECTED Final    Comment: CRITICAL RESULT CALLED TO, READ BACK BY AND VERIFIED WITH: Princella Ion @0234  on 12/04/22 skl    Klebsiella aerogenes NOT DETECTED NOT DETECTED Final   Klebsiella oxytoca NOT DETECTED NOT DETECTED Final   Klebsiella pneumoniae NOT DETECTED NOT DETECTED Final   Proteus species NOT DETECTED NOT DETECTED Final   Salmonella species NOT DETECTED NOT DETECTED Final   Serratia marcescens NOT DETECTED NOT DETECTED Final   Haemophilus influenzae NOT DETECTED NOT DETECTED Final   Neisseria meningitidis NOT DETECTED NOT DETECTED Final   Pseudomonas aeruginosa NOT DETECTED NOT DETECTED Final   Stenotrophomonas maltophilia NOT DETECTED NOT DETECTED Final   Candida albicans NOT DETECTED NOT DETECTED Final   Candida auris NOT DETECTED NOT DETECTED Final   Candida glabrata NOT DETECTED NOT DETECTED Final   Candida krusei NOT DETECTED NOT DETECTED Final   Candida parapsilosis NOT DETECTED NOT DETECTED Final   Candida tropicalis NOT DETECTED NOT DETECTED Final   Cryptococcus neoformans/gattii NOT DETECTED NOT DETECTED Final   CTX-M ESBL NOT DETECTED NOT DETECTED Final   Carbapenem resistance IMP NOT DETECTED NOT DETECTED Final   Carbapenem resistance KPC NOT DETECTED NOT DETECTED Final   Carbapenem  resistance NDM NOT DETECTED NOT DETECTED Final   Carbapenem resist OXA 48 LIKE NOT DETECTED NOT DETECTED  Final   Carbapenem resistance VIM NOT DETECTED NOT DETECTED Final    Comment: Performed at [facility], [address] [place], [place] [address]  Blood Culture (routine x 2)     Status: Abnormal   Collection Time: 12/03/22  1:43 PM   Specimen: BLOOD  Result Value Ref Range Status   Specimen Description   Final    BLOOD LEFT ANTECUBITAL Performed at [facility], [address], [place], [place] [address]    Special Requests   Final    BOTTLES DRAWN AEROBIC AND ANAEROBIC Blood Culture adequate volume Performed at [facility], [address] [place], [place] [address]    Culture  Setup Time   Final    GRAM NEGATIVE RODS IN BOTH AEROBIC AND ANAEROBIC BOTTLES CRITICAL VALUE NOTED.  VALUE IS CONSISTENT WITH PREVIOUSLY REPORTED AND CALLED VALUE. SKL Performed at [facility], [address] [place], [place] [address]    Culture (A)  Final    ESCHERICHIA COLI SUSCEPTIBILITIES PERFORMED ON PREVIOUS CULTURE WITHIN THE LAST 5 DAYS. Performed at [facility], [address] [place], [place] [address]    Report Status 12/06/2022 FINAL  Final  Resp panel by RT-PCR (RSV, Flu A&B, Covid) Anterior Nasal Swab     Status: None   Collection Time: 12/03/22  1:43 PM   Specimen: Anterior Nasal Swab  Result Value Ref Range Status   SARS Coronavirus 2 by RT PCR NEGATIVE NEGATIVE Final    Comment: (NOTE) SARS-CoV-2 target nucleic acids are NOT DETECTED.  The SARS-CoV-2 RNA is generally detectable in upper respiratory specimens during the acute phase of infection. The lowest concentration of SARS-CoV-2 viral copies this assay can detect is 138 copies/mL. A negative result does not preclude SARS-Cov-2 infection and should not be used as the sole basis for treatment or other patient management decisions. A negative result may occur with  improper  specimen collection/handling, submission of specimen other than nasopharyngeal swab, presence of viral mutation(s) within the areas targeted by this assay, and inadequate number of viral copies(<138 copies/mL). A negative result must be combined with clinical observations, patient history, and epidemiological information. The expected result is Negative.  Fact Sheet for Patients:  BloggerCourse.com  Fact Sheet for Healthcare Providers:  SeriousBroker.it  This test is no t yet approved or cleared by the [place] FDA and  has been authorized for detection and/or diagnosis of SARS-CoV-2 by FDA under an Emergency Use Authorization (EUA). This EUA will remain  in effect (meaning this test can be used) for the duration of the COVID-19 declaration under Section 564(b)(1) of the Act, 21 U.S.C.section 360bbb-3(b)(1), unless the authorization is terminated  or revoked sooner.       Influenza A by PCR NEGATIVE NEGATIVE Final   Influenza B by PCR NEGATIVE NEGATIVE Final    Comment: (NOTE) The Xpert Xpress SARS-CoV-2/FLU/RSV plus assay is intended as an aid in the diagnosis of influenza from Nasopharyngeal swab specimens and should not be used as a sole basis for treatment. Nasal washings and aspirates are unacceptable for Xpert Xpress SARS-CoV-2/FLU/RSV testing.  Fact Sheet for Patients: BloggerCourse.com  Fact Sheet for Healthcare Providers: SeriousBroker.it  This test is not yet approved or cleared by the [place] FDA and has been authorized for detection and/or diagnosis of SARS-CoV-2 by FDA under an Emergency Use Authorization (EUA). This EUA will remain in effect (meaning this test can be used) for the duration of the COVID-19 declaration under Section 564(b)(1)  of the Act, 21 U.S.C. section 360bbb-3(b)(1), unless the authorization is terminated or revoked.     Resp  Syncytial Virus by PCR NEGATIVE NEGATIVE Final    Comment: (NOTE) Fact Sheet for Patients: BloggerCourse.com  Fact Sheet for Healthcare Providers: SeriousBroker.it  This test is not yet approved or cleared by the [place] FDA and has been authorized for detection and/or diagnosis of SARS-CoV-2 by FDA under an Emergency Use Authorization (EUA). This EUA will remain in effect (meaning this test can be used) for the duration of the COVID-19 declaration under Section 564(b)(1) of the Act, 21 U.S.C. section 360bbb-3(b)(1), unless the authorization is terminated or revoked.  Performed at [facility], [address], [place], [place] [address]   Urine Culture     Status: Abnormal   Collection Time: 12/03/22  6:01 PM   Specimen: Urine, Random  Result Value Ref Range Status   Specimen Description   Final    URINE, RANDOM Performed at [facility], [address] [place], [place] [address]    Special Requests   Final    NONE Reflexed from [id number] Performed at [facility], [address] [place], [place] [address]    Culture (A)  Final    <10,000 COLONIES/mL INSIGNIFICANT GROWTH Performed at [facility], [address] [place], [place] [address]    Report Status 12/05/2022 FINAL  Final  C Difficile Quick Screen w PCR reflex     Status: None   Collection Time: 12/05/22  3:53 PM   Specimen: STOOL  Result Value Ref Range Status   C Diff antigen NEGATIVE NEGATIVE Final   C Diff toxin NEGATIVE NEGATIVE Final   C Diff interpretation No C. difficile detected.  Final    Comment: Performed at [facility], [address] [place], [place] [address]  Gastrointestinal Panel by PCR , Stool     Status: None   Collection Time: 12/05/22  3:53 PM   Specimen: STOOL  Result Value Ref Range Status   Campylobacter species NOT DETECTED NOT DETECTED Final   Plesimonas shigelloides NOT DETECTED NOT  DETECTED Final   Salmonella species NOT DETECTED NOT DETECTED Final   Yersinia enterocolitica NOT DETECTED NOT DETECTED Final   Vibrio species NOT DETECTED NOT DETECTED Final   Vibrio cholerae NOT DETECTED NOT DETECTED Final   Enteroaggregative E coli (EAEC) NOT DETECTED NOT DETECTED Final   Enteropathogenic E coli (EPEC) NOT DETECTED NOT DETECTED Final   Enterotoxigenic E coli (ETEC) NOT DETECTED NOT DETECTED Final   Shiga like toxin producing E coli (STEC) NOT DETECTED NOT DETECTED Final   Shigella/Enteroinvasive E coli (EIEC) NOT DETECTED NOT DETECTED Final   Cryptosporidium NOT DETECTED NOT DETECTED Final   Cyclospora cayetanensis NOT DETECTED NOT DETECTED Final   Entamoeba histolytica NOT DETECTED NOT DETECTED Final   Giardia lamblia NOT DETECTED NOT DETECTED Final   Adenovirus F40/41 NOT DETECTED NOT DETECTED Final   Astrovirus NOT DETECTED NOT DETECTED Final   Norovirus GI/GII NOT DETECTED NOT DETECTED Final   Rotavirus A NOT DETECTED NOT DETECTED Final   Sapovirus (I, II, IV, and V) NOT DETECTED NOT DETECTED Final    Comment: Performed at [facility], [address] [place], [place] [address]     Labs: CBC: Recent Labs  Lab 12/03/22 1322 12/04/22 0630 12/06/22 0607 12/07/22 0904 12/08/22 0352 12/09/22 0453 12/10/22 0227  WBC 29.2*   < > 13.2* 16.1* 17.7* 17.1* 16.2*  NEUTROABS 24.7*  --   --   --   --   --   --  HGB 13.3   < > 10.9* 10.9* 10.6* 10.7* 10.5*  HCT 37.6*   < > 31.0* 30.0* 30.0* 30.4* 30.2*  MCV 76.7*   < > 75.2* 74.8* 75.2* 75.6* 76.3*  PLT 239   < > 192 204 243 314 359   < > = values in this interval not displayed.   Basic Metabolic Panel: Recent Labs  Lab 12/06/22 0607 12/07/22 0904 12/08/22 0352 12/09/22 0453 12/10/22 0227  NA 135 134* 135 132* 137  K 3.6 3.5 3.3* 3.1* 3.9  CL 100 98 99 97* 103  CO2 25 26 26 26 27   GLUCOSE 224* 232* 182* 177* 158*  BUN 16 16 15 14 11   CREATININE 1.38* 1.07 0.92 0.93 0.83  CALCIUM 8.4* 8.3*  8.4* 8.2* 8.4*  MG 2.1 2.1 2.1 2.0 2.0  PHOS 2.3* 2.5 2.9 3.7 3.8   Liver Function Tests: Recent Labs  Lab 12/06/22 0607 12/07/22 0904 12/08/22 0352 12/09/22 0453 12/10/22 0227  AST 45* 46* 43* 42* 38  ALT 54* 61* 54* 51* 52*  ALKPHOS 81 79 77 72 70  BILITOT 1.7* 1.1 1.2* 0.9 0.9  PROT 6.7 6.9 6.5 6.8 6.8  ALBUMIN 2.5* 2.4* 2.3* 2.4* 2.5*   No results for input(s): "LIPASE", "AMYLASE" in the last 168 hours. No results for input(s): "AMMONIA" in the last 168 hours. Cardiac Enzymes: No results for input(s): "CKTOTAL", "CKMB", "CKMBINDEX", "TROPONINI" in the last 168 hours. BNP (last 3 results) Recent Labs    12/05/22 0402  BNP 65.2   CBG: Recent Labs  Lab 12/09/22 1152 12/09/22 1620 12/09/22 1643 12/09/22 2109 12/10/22 0755  GLUCAP 166* 151* 132* 163* 165*    Time spent: 35 minutes  Signed:  Gillis Santa  [facility] 12/10/2022 12:04 PM

## 2022-12-10 NOTE — Consult Note (Signed)
PHARMACY CONSULT NOTE - ELECTROLYTES  Pharmacy Consult for Electrolyte Monitoring and Replacement   Recent Labs: Height: 6' (182.9 cm) Weight: (!) 239 kg (527 lb) IBW/kg (Calculated) : 77.6 Estimated Creatinine Clearance: 276 mL/min (by C-G formula based on SCr of 0.83 mg/dL). Potassium (mmol/L)  Date Value  12/10/2022 3.9   Magnesium (mg/dL)  Date Value  16/10/9602 2.0   Calcium (mg/dL)  Date Value  54/09/8117 8.4 (L)   Albumin (g/dL)  Date Value  14/78/2956 2.5 (L)   Phosphorus (mg/dL)  Date Value  21/30/8657 3.8   Sodium (mmol/L)  Date Value  12/10/2022 137   Assessment  Lawrence Knight is a 22 y.o. male presenting with right flank pain and dysuria. PMH significant for morbid obesity, asthma. Patient admitted for sepsis secondary to pyelonephritis. Currently being treated for E. Coli bacteremia, urinary source. Pharmacy has been consulted to monitor and replace electrolytes.  Diet: Medium Carb  MIVF: none.  Pertinent medications:  Losartan 25 mg daily.   Goal of Therapy: Electrolytes WNL 12/10/2022  K = 3.9 Mg = 2.1  Plan:  No replacement is warrented at this time F/u with AM labs.   Thank you for allowing pharmacy to be a part of this patient's care.  Effie Shy, PharmD Pharmacy Resident  12/10/2022 4:10 AM

## 2023-02-11 ENCOUNTER — Ambulatory Visit: Payer: Medicaid Other | Admitting: Nurse Practitioner

## 2024-01-14 ENCOUNTER — Other Ambulatory Visit: Payer: Self-pay

## 2024-01-14 ENCOUNTER — Emergency Department

## 2024-01-14 ENCOUNTER — Inpatient Hospital Stay
Admission: EM | Admit: 2024-01-14 | Discharge: 2024-01-16 | DRG: 638 | Disposition: A | Discharge status: Home / Self Care | Attending: Obstetrics and Gynecology | Admitting: Obstetrics and Gynecology

## 2024-01-14 DIAGNOSIS — Z833 Family history of diabetes mellitus: Secondary | ICD-10-CM | POA: Diagnosis not present

## 2024-01-14 DIAGNOSIS — J45909 Unspecified asthma, uncomplicated: Secondary | ICD-10-CM | POA: Diagnosis present

## 2024-01-14 DIAGNOSIS — E1110 Type 2 diabetes mellitus with ketoacidosis without coma: Principal | ICD-10-CM | POA: Diagnosis present

## 2024-01-14 DIAGNOSIS — E875 Hyperkalemia: Secondary | ICD-10-CM | POA: Diagnosis present

## 2024-01-14 DIAGNOSIS — I160 Hypertensive urgency: Secondary | ICD-10-CM | POA: Diagnosis present

## 2024-01-14 DIAGNOSIS — E66813 Obesity, class 3: Secondary | ICD-10-CM | POA: Diagnosis present

## 2024-01-14 DIAGNOSIS — R000 Tachycardia, unspecified: Secondary | ICD-10-CM | POA: Diagnosis present

## 2024-01-14 DIAGNOSIS — Z6841 Body Mass Index (BMI) 40.0 and over, adult: Secondary | ICD-10-CM | POA: Diagnosis not present

## 2024-01-14 DIAGNOSIS — R0600 Dyspnea, unspecified: Secondary | ICD-10-CM

## 2024-01-14 DIAGNOSIS — R350 Frequency of micturition: Secondary | ICD-10-CM | POA: Diagnosis present

## 2024-01-14 DIAGNOSIS — R0682 Tachypnea, not elsewhere classified: Secondary | ICD-10-CM | POA: Diagnosis present

## 2024-01-14 DIAGNOSIS — I10 Essential (primary) hypertension: Secondary | ICD-10-CM | POA: Diagnosis present

## 2024-01-14 LAB — CBC
HCT: 46.8 % (ref 39.0–52.0)
Hemoglobin: 15.9 g/dL (ref 13.0–17.0)
MCH: 26.9 pg (ref 26.0–34.0)
MCHC: 34 g/dL (ref 30.0–36.0)
MCV: 79.1 fL — ABNORMAL LOW (ref 80.0–100.0)
Platelets: 295 K/uL (ref 150–400)
RBC: 5.92 MIL/uL — ABNORMAL HIGH (ref 4.22–5.81)
RDW: 13 % (ref 11.5–15.5)
WBC: 10.3 K/uL (ref 4.0–10.5)
nRBC: 0 % (ref 0.0–0.2)

## 2024-01-14 LAB — COMPREHENSIVE METABOLIC PANEL WITH GFR
ALT: 77 U/L — ABNORMAL HIGH (ref 0–44)
AST: 21 U/L (ref 15–41)
Albumin: 4.4 g/dL (ref 3.5–5.0)
Alkaline Phosphatase: 130 U/L — ABNORMAL HIGH (ref 38–126)
Anion gap: 29 — ABNORMAL HIGH (ref 5–15)
BUN: 15 mg/dL (ref 6–20)
CO2: 9 mmol/L — ABNORMAL LOW (ref 22–32)
Calcium: 10.1 mg/dL (ref 8.9–10.3)
Chloride: 104 mmol/L (ref 98–111)
Creatinine, Ser: 1.21 mg/dL (ref 0.61–1.24)
GFR, Estimated: >60 mL/min
Glucose, Bld: 512 mg/dL (ref 70–99)
Potassium: 5.2 mmol/L — ABNORMAL HIGH (ref 3.5–5.1)
Sodium: 142 mmol/L (ref 135–145)
Total Bilirubin: 0.9 mg/dL (ref 0.0–1.2)
Total Protein: 8.1 g/dL (ref 6.5–8.1)

## 2024-01-14 LAB — CBG MONITORING, ED
Glucose-Capillary: 441 mg/dL — ABNORMAL HIGH (ref 70–99)
Glucose-Capillary: 452 mg/dL — ABNORMAL HIGH (ref 70–99)
Glucose-Capillary: 567 mg/dL (ref 70–99)

## 2024-01-14 LAB — URINALYSIS, ROUTINE W REFLEX MICROSCOPIC
Bacteria, UA: NONE SEEN
Bilirubin Urine: NEGATIVE
Glucose, UA: >=500 mg/dL — AB
Hgb urine dipstick: NEGATIVE
Ketones, ur: 20 mg/dL — AB
Leukocytes,Ua: NEGATIVE
Nitrite: NEGATIVE
Protein, ur: NEGATIVE mg/dL
RBC / HPF: 0 RBC/hpf (ref 0–5)
Specific Gravity, Urine: 1.031 — ABNORMAL HIGH (ref 1.005–1.030)
Squamous Epithelial / HPF: 0 /HPF (ref 0–5)
pH: 5 (ref 5.0–8.0)

## 2024-01-14 LAB — BLOOD GAS, VENOUS
Acid-base deficit: 14.7 mmol/L — ABNORMAL HIGH (ref 0.0–2.0)
Bicarbonate: 9.9 mmol/L — ABNORMAL LOW (ref 20.0–28.0)
O2 Saturation: 99.7 %
Patient temperature: 37
pCO2, Ven: 21 mmHg — ABNORMAL LOW (ref 44–60)
pH, Ven: 7.28 (ref 7.25–7.43)
pO2, Ven: 124 mmHg — ABNORMAL HIGH (ref 32–45)

## 2024-01-14 LAB — MAGNESIUM: Magnesium: 2.3 mg/dL (ref 1.7–2.4)

## 2024-01-14 LAB — LACTIC ACID, PLASMA: Lactic Acid, Venous: 1.8 mmol/L (ref 0.5–1.9)

## 2024-01-14 MED ORDER — LACTATED RINGERS IV BOLUS
1000.0000 mL | Freq: Once | INTRAVENOUS | Status: AC
  Administered 2024-01-14: 1000 mL via INTRAVENOUS

## 2024-01-14 MED ORDER — LACTATED RINGERS IV SOLN: INTRAVENOUS | Status: DC

## 2024-01-14 MED ORDER — DEXTROSE 50 % IV SOLN: 0.0000 mL | INTRAVENOUS | Status: DC | PRN

## 2024-01-14 MED ORDER — INSULIN REGULAR(HUMAN) IN NACL 100-0.9 UT/100ML-% IV SOLN
INTRAVENOUS | Status: DC
  Administered 2024-01-14: 13 [IU]/h via INTRAVENOUS
  Administered 2024-01-15: 16 [IU]/h via INTRAVENOUS
  Administered 2024-01-15: 32 [IU]/h via INTRAVENOUS
  Administered 2024-01-16: 15 [IU]/h via INTRAVENOUS
  Filled 2024-01-14 (×7): qty 100

## 2024-01-14 MED ORDER — DEXTROSE IN LACTATED RINGERS 5 % IV SOLN: INTRAVENOUS | Status: DC

## 2024-01-14 NOTE — ED Provider Notes (Signed)
 "  [facility] Provider Note    Event Date/Time   First MD Initiated Contact with Patient 01/14/24 1854     (approximate)   History   Chief Complaint Shortness of Breath   HPI  Lawrence Knight is a 23 y.o. male with past medical history of asthma and prediabetes who presents to the ED complaining of shortness of breath.  Patient reports that he has been feeling increasingly short of breath over the past 2 days with dizziness and lightheadedness.  He describes increased thirst and frequent urination, feels like his lips stay dry no matter how much he drinks.  He reports that he is prediabetic and does not take any medication for diabetes currently.  He denies any fevers, cough, chest pain, shortness of breath, or dysuria.     Physical Exam   Triage Vital Signs: ED Triage Vitals  Encounter Vitals Group     BP 01/14/24 1810 (!) 170/123     Girls Systolic BP Percentile --      Girls Diastolic BP Percentile --      Boys Systolic BP Percentile --      Boys Diastolic BP Percentile --      Pulse Rate 01/14/24 1810 (!) 155     Resp 01/14/24 1810 (!) 28     Temp 01/14/24 1810 98.1 F (36.7 C)     Temp Source 01/14/24 1810 Oral     SpO2 01/14/24 1810 97 %     Weight 01/14/24 1805 (!) 512 lb (232.2 kg)     Height 01/14/24 1805 6' (1.829 m)     Head Circumference --      Peak Flow --      Pain Score 01/14/24 1805 0     Pain Loc --      Pain Education --      Exclude from Growth Chart --     Most recent vital signs: Vitals:   01/14/24 2158 01/14/24 2316  BP: (!) 156/104 (!) 148/112  Pulse: (!) 122 (!) 118  Resp: 20 18  Temp:  98.3 F (36.8 C)  SpO2: 98% 98%    Constitutional: Alert and oriented. Eyes: Conjunctivae are normal. Head: Atraumatic. Nose: No congestion/rhinnorhea. Mouth/Throat: Mucous membranes are dry. Cardiovascular: Tachycardic, regular rhythm. Grossly normal heart sounds.  2+ radial pulses bilaterally. Respiratory: Tachypneic with  Kussmal respirations.  No retractions. Lungs CTAB. Gastrointestinal: Soft and nontender. No distention. Musculoskeletal: No lower extremity tenderness nor edema.  Neurologic:  Normal speech and language. No gross focal neurologic deficits are appreciated.    ED Results / Procedures / Treatments   Labs (all labs ordered are listed, but only abnormal results are displayed) Labs Reviewed  CBC - Abnormal; Notable for the following components:      Result Value   RBC 5.92 (*)    MCV 79.1 (*)    All other components within normal limits  URINALYSIS, ROUTINE W REFLEX MICROSCOPIC - Abnormal; Notable for the following components:   Color, Urine STRAW (*)    APPearance CLEAR (*)    Specific Gravity, Urine 1.031 (*)    Glucose, UA >=500 (*)    Ketones, ur 20 (*)    All other components within normal limits  BLOOD GAS, VENOUS - Abnormal; Notable for the following components:   pCO2, Ven 21 (*)    pO2, Ven 124 (*)    Bicarbonate 9.9 (*)    Acid-base deficit 14.7 (*)    All other components within normal  limits  BETA-HYDROXYBUTYRIC ACID - Abnormal; Notable for the following components:   Beta-Hydroxybutyric Acid >4.50 (*)    All other components within normal limits  COMPREHENSIVE METABOLIC PANEL WITH GFR - Abnormal; Notable for the following components:   Potassium 5.2 (*)    CO2 9 (*)    Glucose, Bld 512 (*)    ALT 77 (*)    Alkaline Phosphatase 130 (*)    Anion gap 29 (*)    All other components within normal limits  CBG MONITORING, ED - Abnormal; Notable for the following components:   Glucose-Capillary 567 (*)    All other components within normal limits  CBG MONITORING, ED - Abnormal; Notable for the following components:   Glucose-Capillary 452 (*)    All other components within normal limits  CULTURE, BLOOD (ROUTINE X 2)  CULTURE, BLOOD (ROUTINE X 2)  LACTIC ACID, PLASMA  MAGNESIUM   LACTIC ACID, PLASMA  CBG MONITORING, ED     EKG  ED ECG REPORT I, Carlin Palin,  the attending physician, personally viewed and interpreted this ECG.   Date: 01/14/2024  EKG Time: 18:18  Rate: 133  Rhythm: sinus tachycardia  Axis: Normal  Intervals:none  ST&T Change: None  RADIOLOGY Chest x-ray reviewed and interpreted by me with no infiltrate, edema, or effusion.  PROCEDURES:  Critical Care performed: Yes, see critical care procedure note(s)  .Critical Care  Performed by: Palin Carlin, MD Authorized by: Palin Carlin, MD   Critical care provider statement:    Critical care time (minutes):  30   Critical care time was exclusive of:  Separately billable procedures and treating other patients and teaching time   Critical care was necessary to treat or prevent imminent or life-threatening deterioration of the following conditions:  Endocrine crisis and metabolic crisis   Critical care was time spent personally by me on the following activities:  Development of treatment plan with patient or surrogate, discussions with consultants, evaluation of patient's response to treatment, examination of patient, ordering and review of laboratory studies, ordering and review of radiographic studies, ordering and performing treatments and interventions, pulse oximetry, re-evaluation of patient's condition and review of old charts   I assumed direction of critical care for this patient from another provider in my specialty: no     Care discussed with: admitting provider      MEDICATIONS ORDERED IN ED: Medications  insulin  regular, human (MYXREDLIN) 100 units/ 100 mL infusion (13 Units/hr Intravenous New Bag/Given 01/14/24 2324)  lactated ringers  infusion (has no administration in time range)  dextrose  5 % in lactated ringers  infusion (has no administration in time range)  dextrose  50 % solution 0-50 mL (has no administration in time range)  lactated ringers  bolus 1,000 mL (1,000 mLs Intravenous New Bag/Given 01/14/24 2015)  lactated ringers  bolus 1,000 mL (1,000 mLs  Intravenous New Bag/Given 01/14/24 1927)     IMPRESSION / MDM / ASSESSMENT AND PLAN / ED COURSE  I reviewed the triage vital signs and the nursing notes.                              23 y.o. male with past medical history of hypertension and prediabetes who presents to the ED complaining of shortness of breath, lightheadedness, polydipsia, and polyuria over the past 2 to 3 days.  Patient's presentation is most consistent with acute presentation with potential threat to life or bodily function.  Differential diagnosis includes, but is not limited  to, DKA, HHS, dehydration, electrolyte abnormality, AKI, sepsis, UTI, pneumonia.  Patient ill-appearing with increased work of breathing, tachypneic with what appears to be coo small respirations.  CBG was noted to be high in triage and high suspicion for DKA, resuscitation with 2 L of IV fluids was initiated.  While we were able to obtain IV access quickly, obtaining blood sample from IV was unsuccessful and additional attempts by phlebotomy were also unsuccessful.  I attempted IV placement and a small vein in patient's right antecubital fossa, however IV was inadvertently placed in an artery.  Labs were drawn from the IV prior to its removal, results pending at this time.  Chest x-ray is unremarkable, sepsis workup initiated but we will hold off on antibiotics for now.  Labs consistent with DKA given hyperglycemia, increased anion gap, and bicarb of 9.  He has mild hyperkalemia that should improve with insulin  drip, no significant AKI noted.  VBG with pH of 7.28, beta hydroxybutyrate elevated consistent with DKA.  No significant anemia or leukocytosis noted and no findings concerning for sepsis at this time.  Case discussed with hospitalist for admission.      FINAL CLINICAL IMPRESSION(S) / ED DIAGNOSES   Final diagnoses:  Diabetic ketoacidosis without coma associated with type 2 diabetes mellitus (HCC)     Rx / DC Orders   ED Discharge  Orders     None        Note:  This document was prepared using Dragon voice recognition software and may include unintentional dictation errors.   Willo Dunnings, MD 01/14/24 [phone]  "

## 2024-01-14 NOTE — ED Notes (Signed)
 Lab called to collect blood work due to pt being difficult stick

## 2024-01-14 NOTE — ED Triage Notes (Signed)
 Pt BIB [facility] from home SOB and dizziness X 2 days. Endorses increased urination, thirst. EMS reports CBG 550.     HR 123  171/95 100% RA Cbg 550  20 LAC IVF 200 mL

## 2024-01-14 NOTE — ED Notes (Signed)
 Lab called a second time to collect blood work

## 2024-01-15 DIAGNOSIS — I160 Hypertensive urgency: Secondary | ICD-10-CM

## 2024-01-15 DIAGNOSIS — E66813 Obesity, class 3: Secondary | ICD-10-CM | POA: Insufficient documentation

## 2024-01-15 DIAGNOSIS — E1110 Type 2 diabetes mellitus with ketoacidosis without coma: Principal | ICD-10-CM

## 2024-01-15 DIAGNOSIS — R0600 Dyspnea, unspecified: Secondary | ICD-10-CM

## 2024-01-15 LAB — BASIC METABOLIC PANEL WITH GFR
Anion gap: 27 — ABNORMAL HIGH (ref 5–15)
Anion gap: 20 — ABNORMAL HIGH (ref 5–15)
Anion gap: 15 (ref 5–15)
BUN: 13 mg/dL (ref 6–20)
BUN: 12 mg/dL (ref 6–20)
BUN: 9 mg/dL (ref 6–20)
CO2: 11 mmol/L — ABNORMAL LOW (ref 22–32)
CO2: 14 mmol/L — ABNORMAL LOW (ref 22–32)
CO2: 15 mmol/L — ABNORMAL LOW (ref 22–32)
Calcium: 9.7 mg/dL (ref 8.9–10.3)
Calcium: 10 mg/dL (ref 8.9–10.3)
Calcium: 9.1 mg/dL (ref 8.9–10.3)
Chloride: 107 mmol/L (ref 98–111)
Chloride: 110 mmol/L (ref 98–111)
Chloride: 111 mmol/L (ref 98–111)
Creatinine, Ser: 1.19 mg/dL (ref 0.61–1.24)
Creatinine, Ser: 1.16 mg/dL (ref 0.61–1.24)
Creatinine, Ser: 0.95 mg/dL (ref 0.61–1.24)
GFR, Estimated: >60 mL/min
GFR, Estimated: >60 mL/min
GFR, Estimated: >60 mL/min
Glucose, Bld: 361 mg/dL — ABNORMAL HIGH (ref 70–99)
Glucose, Bld: 235 mg/dL — ABNORMAL HIGH (ref 70–99)
Glucose, Bld: 488 mg/dL — ABNORMAL HIGH (ref 70–99)
Potassium: 4.2 mmol/L (ref 3.5–5.1)
Potassium: 3.7 mmol/L (ref 3.5–5.1)
Potassium: 3.5 mmol/L (ref 3.5–5.1)
Sodium: 145 mmol/L (ref 135–145)
Sodium: 145 mmol/L (ref 135–145)
Sodium: 140 mmol/L (ref 135–145)

## 2024-01-15 LAB — HEMOGLOBIN A1C
Hgb A1c MFr Bld: 11.9 % — ABNORMAL HIGH (ref 4.8–5.6)
Mean Plasma Glucose: 294.83 mg/dL

## 2024-01-15 LAB — CBG MONITORING, ED
Glucose-Capillary: 169 mg/dL — ABNORMAL HIGH (ref 70–99)
Glucose-Capillary: 184 mg/dL — ABNORMAL HIGH (ref 70–99)
Glucose-Capillary: 212 mg/dL — ABNORMAL HIGH (ref 70–99)
Glucose-Capillary: 223 mg/dL — ABNORMAL HIGH (ref 70–99)
Glucose-Capillary: 226 mg/dL — ABNORMAL HIGH (ref 70–99)
Glucose-Capillary: 232 mg/dL — ABNORMAL HIGH (ref 70–99)
Glucose-Capillary: 253 mg/dL — ABNORMAL HIGH (ref 70–99)
Glucose-Capillary: 255 mg/dL — ABNORMAL HIGH (ref 70–99)
Glucose-Capillary: 260 mg/dL — ABNORMAL HIGH (ref 70–99)
Glucose-Capillary: 262 mg/dL — ABNORMAL HIGH (ref 70–99)
Glucose-Capillary: 266 mg/dL — ABNORMAL HIGH (ref 70–99)
Glucose-Capillary: 268 mg/dL — ABNORMAL HIGH (ref 70–99)
Glucose-Capillary: 275 mg/dL — ABNORMAL HIGH (ref 70–99)
Glucose-Capillary: 279 mg/dL — ABNORMAL HIGH (ref 70–99)
Glucose-Capillary: 289 mg/dL — ABNORMAL HIGH (ref 70–99)
Glucose-Capillary: 293 mg/dL — ABNORMAL HIGH (ref 70–99)
Glucose-Capillary: 303 mg/dL — ABNORMAL HIGH (ref 70–99)
Glucose-Capillary: 307 mg/dL — ABNORMAL HIGH (ref 70–99)
Glucose-Capillary: 308 mg/dL — ABNORMAL HIGH (ref 70–99)
Glucose-Capillary: 319 mg/dL — ABNORMAL HIGH (ref 70–99)
Glucose-Capillary: 322 mg/dL — ABNORMAL HIGH (ref 70–99)
Glucose-Capillary: 326 mg/dL — ABNORMAL HIGH (ref 70–99)
Glucose-Capillary: 356 mg/dL — ABNORMAL HIGH (ref 70–99)
Glucose-Capillary: 358 mg/dL — ABNORMAL HIGH (ref 70–99)
Glucose-Capillary: 396 mg/dL — ABNORMAL HIGH (ref 70–99)
Glucose-Capillary: 411 mg/dL — ABNORMAL HIGH (ref 70–99)

## 2024-01-15 LAB — D-DIMER, QUANTITATIVE: D-Dimer, Quant: 0.5 ug{FEU}/mL (ref 0.00–0.50)

## 2024-01-15 LAB — BETA-HYDROXYBUTYRIC ACID
Beta-Hydroxybutyric Acid: 4.83 mmol/L — ABNORMAL HIGH (ref 0.05–0.27)
Beta-Hydroxybutyric Acid: 0.56 mmol/L — ABNORMAL HIGH (ref 0.05–0.27)

## 2024-01-15 LAB — HIV ANTIBODY (ROUTINE TESTING W REFLEX): HIV Screen 4th Generation wRfx: NONREACTIVE

## 2024-01-15 MED ORDER — LOSARTAN POTASSIUM 50 MG PO TABS: 50.0000 mg | ORAL_TABLET | Freq: Every day | ORAL | Status: DC

## 2024-01-15 MED ORDER — LABETALOL HCL 5 MG/ML IV SOLN
10.0000 mg | Freq: Once | INTRAVENOUS | Status: AC
  Administered 2024-01-15: 10 mg via INTRAVENOUS
  Filled 2024-01-15: qty 4

## 2024-01-15 MED ORDER — POTASSIUM CHLORIDE CRYS ER 20 MEQ PO TBCR
40.0000 meq | EXTENDED_RELEASE_TABLET | ORAL | Status: AC
  Administered 2024-01-15 (×3): 40 meq via ORAL
  Filled 2024-01-15 (×3): qty 2

## 2024-01-15 MED ORDER — AMLODIPINE BESYLATE 10 MG PO TABS
10.0000 mg | ORAL_TABLET | Freq: Every day | ORAL | Status: DC
  Administered 2024-01-15 – 2024-01-16 (×2): 10 mg via ORAL
  Filled 2024-01-15: qty 1
  Filled 2024-01-15: qty 2

## 2024-01-15 MED ORDER — POTASSIUM CHLORIDE 20 MEQ PO PACK
60.0000 meq | PACK | Freq: Once | ORAL | Status: AC
  Administered 2024-01-15: 60 meq via ORAL
  Filled 2024-01-15: qty 3

## 2024-01-15 MED ORDER — ENOXAPARIN SODIUM 120 MG/0.8ML IJ SOSY
0.5000 mg/kg | PREFILLED_SYRINGE | INTRAMUSCULAR | Status: DC
  Administered 2024-01-15 – 2024-01-16 (×2): 117 mg via SUBCUTANEOUS
  Filled 2024-01-15 (×2): qty 0.78

## 2024-01-15 NOTE — Assessment & Plan Note (Signed)
 IV fluids, insulin  infusion and electrolyte repletion per Endo tool Transition to subcu insulin  when gap is closed and less acidotic Follow A1c Will need intensive diabetic education

## 2024-01-15 NOTE — ED Notes (Signed)
 Dr Kandis notified via secure chat [phone]

## 2024-01-15 NOTE — Hospital Course (Signed)
 Lawrence Knight

## 2024-01-15 NOTE — Hospital Course (Addendum)
 SABRA

## 2024-01-15 NOTE — ED Notes (Signed)
 Provider reached out to about pt's elevated HR and BP.

## 2024-01-15 NOTE — Progress Notes (Signed)
 Anticoagulation monitoring(Lovenox ):  24 yo male ordered Lovenox  40 mg Q24h    Filed Weights   01/14/24 1805  Weight: (!) 232.2 kg (512 lb)   BMI 69.4    Lab Results  Component Value Date   CREATININE 1.21 01/14/2024   CREATININE 0.83 12/10/2022   CREATININE 0.93 12/09/2022   Estimated Creatinine Clearance: 184 mL/min (by C-G formula based on SCr of 1.21 mg/dL). Hemoglobin & Hematocrit     Component Value Date/Time   HGB 15.9 01/14/2024 1810   HCT 46.8 01/14/2024 1810     Per Protocol for Patient with estCrcl > 30 ml/min and BMI > 30, will transition to Lovenox  117 mg Q24h.

## 2024-01-15 NOTE — Progress Notes (Signed)
 " PROGRESS NOTE    Phuc Kluttz  FMW:[id number] DOB: [birth year] DOA: 01/14/2024 PCP: Pcp, No  Outpatient Specialists: none    Brief Narrative:   Lawrence Knight is a 24 y.o. male with medical history significant for Morbid obesity, BMI 69, prediabetes, not on any meds, being admitted with new onset diabetes with DKA.  He presented with 2 days of shortness of breath , weakness and feeling unwell, polydipsia and polyuria He denies cough, fever or chills, abdominal pain, vomiting or change in bowel habits.  Denies lower extremity pain or swelling.  Non-smoker In the ED, tachycardic and tachypneic with elevated BP up to 170/123.  Afebrile Labs notable for glucose 512 with anion gap 29.  Bicarb under 9.  VBG with pH 7.28.  Beta hydroxybutyric acid greater than 4.5.  UA with ketones only.  CBC unremarkable.EKG showed sinus tachycardia at 133.  Chest x-ray clear. Patient was treated with fluid bolus started on IV insulin  Admission requested    Assessment & Plan:   Principal Problem:   Diabetic ketoacidosis without coma associated with type 2 diabetes mellitus (HCC) Active Problems:   Acute dyspnea   Hypertensive urgency   Obesity, Class III, BMI 40-49.9 (morbid obesity) (HCC)   # DKA Dx dm last year, patient did not address this. Doesn't have a pcp, doesn't see a doctor. Here with several days polydipsia and polyurea. A1c in the 11s. DKA improving. No s/s infectioni - continue fluids, iv insulin  - trend labs - dm educator - f/u lipids  # Elevated blood pressure Likely undiagnosed htn. Acute illness may be contributing. Moderate bp elevation this morning - start amlodipine  # Morbid obesity The primary driver of the above problems   DVT prophylaxis: lovenox  Code Status: full Family Communication: none at bedside  Level of care: Stepdown Status is: Inpatient Remains inpatient appropriate because: severity of illness    Consultants:  none  Procedures: none  Antimicrobials:   none    Subjective: Reports ongoing thirst.   Objective: Vitals:   01/15/24 0330 01/15/24 0430 01/15/24 0630 01/15/24 0700  BP: (!) 154/117 (!) 173/105 (!) 145/114 (!) 151/113  Pulse: (!) 116 (!) 116 (!) 113 (!) 109  Resp: (!) 21 20 17 17   Temp:      TempSrc:      SpO2: 98% 97% 100% 98%  Weight:      Height:        Intake/Output Summary (Last 24 hours) at 01/15/2024 0820 Last data filed at 01/15/2024 0313 Gross per 24 hour  Intake 56 ml  Output --  Net 56 ml   Filed Weights   01/14/24 1805  Weight: (!) 232.2 kg    Examination:  General exam: Appears calm and comfortable  Respiratory system: Clear to auscultation. Respiratory effort normal. Cardiovascular system: S1 & S2 heard, RRR. No JVD, murmurs, rubs, gallops or clicks. No pedal edema. Gastrointestinal system: Abdomen is obese, soft and nontender.   Central nervous system: Alert and oriented. No focal neurological deficits. Extremities: Symmetric 5 x 5 power. Skin: No rashes, lesions or ulcers Psychiatry: Judgement and insight appear normal. Mood & affect appropriate.     Data Reviewed: I have personally reviewed following labs and imaging studies  CBC: Recent Labs  Lab 01/14/24 1810  WBC 10.3  HGB 15.9  HCT 46.8  MCV 79.1*  PLT 295   Basic Metabolic Panel: Recent Labs  Lab 01/14/24 2132 01/15/24 0158 01/15/24 0611  NA 142 145 145  K 5.2* 4.2 3.7  CL 104 107 110  CO2 9* 11* 14*  GLUCOSE 512* 361* 235*  BUN 15 13 12   CREATININE 1.21 1.19 1.16  CALCIUM 10.1 9.7 10.0  MG 2.3  --   --    GFR: Estimated Creatinine Clearance: 191.9 mL/min (by C-G formula based on SCr of 1.16 mg/dL). Liver Function Tests: Recent Labs  Lab 01/14/24 2132  AST 21  ALT 77*  ALKPHOS 130*  BILITOT 0.9  PROT 8.1  ALBUMIN 4.4   No results for input(s): LIPASE, AMYLASE in the last 168 hours. No results for input(s): AMMONIA in the last 168 hours. Coagulation Profile: No results for input(s): INR,  PROTIME in the last 168 hours. Cardiac Enzymes: No results for input(s): CKTOTAL, CKMB, CKMBINDEX, TROPONINI in the last 168 hours. BNP (last 3 results) No results for input(s): PROBNP in the last 8760 hours. HbA1C: Recent Labs    01/15/24 0158  HGBA1C 11.9*   CBG: Recent Labs  Lab 01/15/24 0312 01/15/24 0418 01/15/24 0520 01/15/24 0621 01/15/24 0722  GLUCAP 308* 293* 253* 226* 262*   Lipid Profile: No results for input(s): CHOL, HDL, LDLCALC, TRIG, CHOLHDL, LDLDIRECT in the last 72 hours. Thyroid Function Tests: No results for input(s): TSH, T4TOTAL, FREET4, T3FREE, THYROIDAB in the last 72 hours. Anemia Panel: No results for input(s): VITAMINB12, FOLATE, FERRITIN, TIBC, IRON , RETICCTPCT in the last 72 hours. Urine analysis:    Component Value Date/Time   COLORURINE STRAW (A) 01/14/2024 1810   APPEARANCEUR CLEAR (A) 01/14/2024 1810   LABSPEC 1.031 (H) 01/14/2024 1810   PHURINE 5.0 01/14/2024 1810   GLUCOSEU >=500 (A) 01/14/2024 1810   HGBUR NEGATIVE 01/14/2024 1810   BILIRUBINUR NEGATIVE 01/14/2024 1810   KETONESUR 20 (A) 01/14/2024 1810   PROTEINUR NEGATIVE 01/14/2024 1810   NITRITE NEGATIVE 01/14/2024 1810   LEUKOCYTESUR NEGATIVE 01/14/2024 1810   Sepsis Labs: @LABRCNTIP (procalcitonin:4,lacticidven:4)  ) Recent Results (from the past 240 hours)  Culture, blood (routine x 2)     Status: None (Preliminary result)   Collection Time: 01/14/24  9:32 PM   Specimen: BLOOD  Result Value Ref Range Status   Specimen Description BLOOD RIGHT ANTECUBITAL  Final   Special Requests   Final    BOTTLES DRAWN AEROBIC AND ANAEROBIC Blood Culture adequate volume   Culture   Final    NO GROWTH < 12 HOURS Performed at [facility], [address], [place], [place] [address]    Report Status PENDING  Incomplete         Radiology Studies: DG Chest Portable 1 View Result Date: 01/14/2024 EXAM: 1 VIEW(S) XRAY OF THE  CHEST 01/14/2024 07:11:03 PM COMPARISON: 12/03/2022 CLINICAL HISTORY: SOB FINDINGS: LUNGS AND PLEURA: Low lung volumes. No focal pulmonary opacity. No pleural effusion. No pneumothorax. HEART AND MEDIASTINUM: No acute abnormality of the cardiac and mediastinal silhouettes. BONES AND SOFT TISSUES: No acute osseous abnormality. IMPRESSION: 1. No acute findings. Electronically signed by: Greig Pique MD 01/14/2024 08:12 PM EST RP Workstation: [id number]        Scheduled Meds:  enoxaparin  (LOVENOX ) injection  0.5 mg/kg Subcutaneous Q24H   losartan   50 mg Oral Daily   Continuous Infusions:  dextrose  5% lactated ringers  125 mL/hr at 01/15/24 0642   insulin  19 Units/hr (01/15/24 0724)   lactated ringers  Stopped (01/15/24 9360)     LOS: 1 day     Devaughn KATHEE Ban, MD [facility]   If 7PM-7AM, please contact night-coverage [email or web address] Password [email or web address] 01/15/2024, 8:20 AM     "

## 2024-01-15 NOTE — H&P (Addendum)
 " History and Physical    Patient: Lawrence Knight DOB: [birth year] DOA: 01/14/2024 DOS: the patient was seen and examined on 01/15/2024 PCP: Pcp, No  Patient coming from: Home  Chief Complaint:  Chief Complaint  Patient presents with   Shortness of Breath    HPI: Lawrence Knight is a 24 y.o. male with medical history significant for Morbid obesity, BMI 69, prediabetes, not on any meds, being admitted with new onset diabetes with DKA.  He presented with 2 days of shortness of breath , weakness and feeling unwell, polydipsia and polyuria He denies cough, fever or chills, abdominal pain, vomiting or change in bowel habits.  Denies lower extremity pain or swelling.  Non-smoker In the ED, tachycardic and tachypneic with elevated BP up to 170/123.  Afebrile Labs notable for glucose 512 with anion gap 29.  Bicarb under 9.  VBG with pH 7.28.  Beta hydroxybutyric acid greater than 4.5.  UA with ketones only.  CBC unremarkable.EKG showed sinus tachycardia at 133.  Chest x-ray clear. Patient was treated with fluid bolus started on IV insulin  Admission requested     Past Medical History:  Diagnosis Date   Asthma    Morbid obesity with BMI of 70 and over, adult [facility])    History reviewed. No pertinent surgical history. Social History:  reports that he has never smoked. He has never used smokeless tobacco. He reports that he does not currently use alcohol. He reports that he does not use drugs.  Allergies[1]  Family History  Problem Relation Age of Onset   Diabetes Mother     Prior to Admission medications  Not on File    Physical Exam: Vitals:   01/14/24 1930 01/14/24 2158 01/14/24 2316 01/15/24 0030  BP:  (!) 156/104 (!) 148/112 (!) 166/118  Pulse: (!) 123 (!) 122 (!) 118 (!) 126  Resp:  20 18 (!) 37  Temp:   98.3 F (36.8 C)   TempSrc:   Oral   SpO2: 97% 98% 98% 100%  Weight:      Height:       Physical Exam Vitals and nursing note reviewed.  Constitutional:       General: He is not in acute distress.    Appearance: He is morbidly obese.  HENT:     Head: Normocephalic and atraumatic.  Cardiovascular:     Rate and Rhythm: Regular rhythm. Tachycardia present.     Heart sounds: Normal heart sounds.  Pulmonary:     Effort: Tachypnea present.     Breath sounds: Normal breath sounds.  Abdominal:     Palpations: Abdomen is soft.     Tenderness: There is no abdominal tenderness.  Neurological:     Mental Status: Mental status is at baseline.     Labs on Admission: I have personally reviewed following labs and imaging studies  CBC: Recent Labs  Lab 01/14/24 1810  WBC 10.3  HGB 15.9  HCT 46.8  MCV 79.1*  PLT 295   Basic Metabolic Panel: Recent Labs  Lab 01/14/24 2132  NA 142  K 5.2*  CL 104  CO2 9*  GLUCOSE 512*  BUN 15  CREATININE 1.21  CALCIUM 10.1  MG 2.3   GFR: Estimated Creatinine Clearance: 184 mL/min (by C-G formula based on SCr of 1.21 mg/dL). Liver Function Tests: Recent Labs  Lab 01/14/24 2132  AST 21  ALT 77*  ALKPHOS 130*  BILITOT 0.9  PROT 8.1  ALBUMIN 4.4   No results for input(s):  LIPASE, AMYLASE in the last 168 hours. No results for input(s): AMMONIA in the last 168 hours. Coagulation Profile: No results for input(s): INR, PROTIME in the last 168 hours. Cardiac Enzymes: No results for input(s): CKTOTAL, CKMB, CKMBINDEX, TROPONINI in the last 168 hours. BNP (last 3 results) No results for input(s): PROBNP in the last 8760 hours. HbA1C: No results for input(s): HGBA1C in the last 72 hours. CBG: Recent Labs  Lab 01/14/24 1810 01/14/24 2319 01/14/24 2355 01/15/24 0026 01/15/24 0100  GLUCAP 567* 452* 441* 411* 396*   Lipid Profile: No results for input(s): CHOL, HDL, LDLCALC, TRIG, CHOLHDL, LDLDIRECT in the last 72 hours. Thyroid Function Tests: No results for input(s): TSH, T4TOTAL, FREET4, T3FREE, THYROIDAB in the last 72 hours. Anemia Panel: No  results for input(s): VITAMINB12, FOLATE, FERRITIN, TIBC, IRON , RETICCTPCT in the last 72 hours. Urine analysis:    Component Value Date/Time   COLORURINE STRAW (A) 01/14/2024 1810   APPEARANCEUR CLEAR (A) 01/14/2024 1810   LABSPEC 1.031 (H) 01/14/2024 1810   PHURINE 5.0 01/14/2024 1810   GLUCOSEU >=500 (A) 01/14/2024 1810   HGBUR NEGATIVE 01/14/2024 1810   BILIRUBINUR NEGATIVE 01/14/2024 1810   KETONESUR 20 (A) 01/14/2024 1810   PROTEINUR NEGATIVE 01/14/2024 1810   NITRITE NEGATIVE 01/14/2024 1810   LEUKOCYTESUR NEGATIVE 01/14/2024 1810    Radiological Exams on Admission: DG Chest Portable 1 View Result Date: 01/14/2024 EXAM: 1 VIEW(S) XRAY OF THE CHEST 01/14/2024 07:11:03 PM COMPARISON: 12/03/2022 CLINICAL HISTORY: SOB FINDINGS: LUNGS AND PLEURA: Low lung volumes. No focal pulmonary opacity. No pleural effusion. No pneumothorax. HEART AND MEDIASTINUM: No acute abnormality of the cardiac and mediastinal silhouettes. BONES AND SOFT TISSUES: No acute osseous abnormality. IMPRESSION: 1. No acute findings. Electronically signed by: Greig Pique MD 01/14/2024 08:12 PM EST RP Workstation: [id number]   Data Reviewed for HPI: Relevant notes from primary care and specialist visits, past discharge summaries as available in EHR, including Care Everywhere. Prior diagnostic testing as pertinent to current admission diagnoses Updated medications and problem lists for reconciliation ED course, including vitals, labs, imaging, treatment and response to treatment Triage notes, nursing and pharmacy notes and ED provider's notes Notable results as noted above in HPI      Assessment and Plan: * Diabetic ketoacidosis without coma associated with type 2 diabetes mellitus (HCC) IV fluids, insulin  infusion and electrolyte repletion per Endo tool Transition to subcu insulin  when gap is closed and less acidotic Follow A1c Will need intensive diabetic education  Acute dyspnea Chest x-ray  clear and no cough or congestion no chest pain Patient is tachycardic and tachypneic Possibly related to DKA Will get D-dimer and follow-up with CTA chest if elevated--> negative at 0.5 Hoping for improvement with treatment of DKA  Hypertensive urgency BP 170/123 on arrival, likely undiagnosed hypertension Will start oral antihypertensives with IV hydralazine  as needed  Obesity, Class III, BMI 40-49.9 (morbid obesity) (HCC) Complicating factor to overall prognosis and care     DVT prophylaxis: Lovenox   Consults: none  Advance Care Planning:   Code Status: Prior   Family Communication: none  Disposition Plan: Back to previous home environment  Severity of Illness: The appropriate patient status for this patient is INPATIENT. Inpatient status is judged to be reasonable and necessary in order to provide the required intensity of service to ensure the patient's safety. The patient's presenting symptoms, physical exam findings, and initial radiographic and laboratory data in the context of their chronic comorbidities is felt to place them at high risk  for further clinical deterioration. Furthermore, it is not anticipated that the patient will be medically stable for discharge from the hospital within 2 midnights of admission.   * I certify that at the point of admission it is Lawrence clinical judgment that the patient will require inpatient hospital care spanning beyond 2 midnights from the point of admission due to high intensity of service, high risk for further deterioration and high frequency of surveillance required.*  Author: Delayne LULLA Solian, MD 01/15/2024 1:37 AM  For on call review [email or web address].      [1] No Known Allergies  "

## 2024-01-15 NOTE — Assessment & Plan Note (Addendum)
 Chest x-ray clear and no cough or congestion no chest pain Patient is tachycardic and tachypneic Possibly related to DKA Will get D-dimer and follow-up with CTA chest if elevated--> negative at 0.5 Hoping for improvement with treatment of DKA

## 2024-01-15 NOTE — Assessment & Plan Note (Signed)
 Complicating factor to overall prognosis and care

## 2024-01-15 NOTE — Assessment & Plan Note (Signed)
 BP 170/123 on arrival, likely undiagnosed hypertension Will start oral antihypertensives with IV hydralazine  as needed

## 2024-01-15 NOTE — ED Notes (Signed)
 RN called pharmacy requesting insulin  for pt's insulin  drip as pyxis is out and there is none in any of the other departments pyxis'.

## 2024-01-16 ENCOUNTER — Telehealth (HOSPITAL_COMMUNITY): Payer: Self-pay

## 2024-01-16 ENCOUNTER — Encounter: Payer: Self-pay | Admitting: Internal Medicine

## 2024-01-16 ENCOUNTER — Other Ambulatory Visit: Payer: Self-pay

## 2024-01-16 ENCOUNTER — Other Ambulatory Visit (HOSPITAL_COMMUNITY): Payer: Self-pay

## 2024-01-16 LAB — LIPID PANEL
Cholesterol: 292 mg/dL — ABNORMAL HIGH (ref 0–200)
HDL: 34 mg/dL — ABNORMAL LOW
LDL Cholesterol: 226 mg/dL — ABNORMAL HIGH (ref 0–99)
Total CHOL/HDL Ratio: 8.7 ratio
Triglycerides: 165 mg/dL — ABNORMAL HIGH
VLDL: 33 mg/dL (ref 0–40)

## 2024-01-16 LAB — CBG MONITORING, ED
Glucose-Capillary: 154 mg/dL — ABNORMAL HIGH (ref 70–99)
Glucose-Capillary: 154 mg/dL — ABNORMAL HIGH (ref 70–99)
Glucose-Capillary: 167 mg/dL — ABNORMAL HIGH (ref 70–99)
Glucose-Capillary: 168 mg/dL — ABNORMAL HIGH (ref 70–99)
Glucose-Capillary: 188 mg/dL — ABNORMAL HIGH (ref 70–99)
Glucose-Capillary: 251 mg/dL — ABNORMAL HIGH (ref 70–99)

## 2024-01-16 LAB — BASIC METABOLIC PANEL WITH GFR
Anion gap: 10 (ref 5–15)
Anion gap: 9 (ref 5–15)
Anion gap: 12 (ref 5–15)
BUN: 8 mg/dL (ref 6–20)
BUN: 8 mg/dL (ref 6–20)
BUN: 8 mg/dL (ref 6–20)
CO2: 21 mmol/L — ABNORMAL LOW (ref 22–32)
CO2: 22 mmol/L (ref 22–32)
CO2: 20 mmol/L — ABNORMAL LOW (ref 22–32)
Calcium: 9.6 mg/dL (ref 8.9–10.3)
Calcium: 9.6 mg/dL (ref 8.9–10.3)
Calcium: 9.6 mg/dL (ref 8.9–10.3)
Chloride: 115 mmol/L — ABNORMAL HIGH (ref 98–111)
Chloride: 114 mmol/L — ABNORMAL HIGH (ref 98–111)
Chloride: 110 mmol/L (ref 98–111)
Creatinine, Ser: 0.86 mg/dL (ref 0.61–1.24)
Creatinine, Ser: 0.91 mg/dL (ref 0.61–1.24)
Creatinine, Ser: 0.89 mg/dL (ref 0.61–1.24)
GFR, Estimated: >60 mL/min
GFR, Estimated: >60 mL/min
GFR, Estimated: >60 mL/min
Glucose, Bld: 160 mg/dL — ABNORMAL HIGH (ref 70–99)
Glucose, Bld: 159 mg/dL — ABNORMAL HIGH (ref 70–99)
Glucose, Bld: 325 mg/dL — ABNORMAL HIGH (ref 70–99)
Potassium: 3.5 mmol/L (ref 3.5–5.1)
Potassium: 4 mmol/L (ref 3.5–5.1)
Potassium: 4.7 mmol/L (ref 3.5–5.1)
Sodium: 145 mmol/L (ref 135–145)
Sodium: 145 mmol/L (ref 135–145)
Sodium: 142 mmol/L (ref 135–145)

## 2024-01-16 LAB — BETA-HYDROXYBUTYRIC ACID
Beta-Hydroxybutyric Acid: >8 mmol/L — ABNORMAL HIGH (ref 0.05–0.27)
Beta-Hydroxybutyric Acid: >8 mmol/L — ABNORMAL HIGH (ref 0.05–0.27)
Beta-Hydroxybutyric Acid: 0.22 mmol/L (ref 0.05–0.27)
Beta-Hydroxybutyric Acid: 1.34 mmol/L — ABNORMAL HIGH (ref 0.05–0.27)

## 2024-01-16 LAB — GLUCOSE, CAPILLARY: Glucose-Capillary: 300 mg/dL — ABNORMAL HIGH (ref 70–99)

## 2024-01-16 MED ORDER — PEN NEEDLES 31G X 5 MM MISC
1.0000 | Freq: Every evening | 1 refills | Status: AC
  Filled 2024-01-16: qty 100, 25d supply, fill #0

## 2024-01-16 MED ORDER — INSULIN ASPART 100 UNIT/ML IJ SOLN: 0.0000 [IU] | Freq: Every day | INTRAMUSCULAR | Status: DC

## 2024-01-16 MED ORDER — LANCET DEVICE MISC
1.0000 | 0 refills | Status: AC
  Filled 2024-01-16: qty 1, fill #0

## 2024-01-16 MED ORDER — BLOOD GLUCOSE TEST VI STRP
1.0000 | ORAL_STRIP | 0 refills | Status: AC
  Filled 2024-01-16: qty 100, 50d supply, fill #0

## 2024-01-16 MED ORDER — INSULIN GLARGINE 100 UNIT/ML ~~LOC~~ SOLN
15.0000 [IU] | SUBCUTANEOUS | Status: DC
  Administered 2024-01-16: 15 [IU] via SUBCUTANEOUS
  Filled 2024-01-16: qty 0.15

## 2024-01-16 MED ORDER — LIVING WELL WITH DIABETES BOOK
Freq: Once | Status: AC
  Filled 2024-01-16 (×3): qty 1

## 2024-01-16 MED ORDER — INSULIN GLARGINE 100 UNIT/ML ~~LOC~~ SOLN
15.0000 [IU] | Freq: Once | SUBCUTANEOUS | Status: AC
  Administered 2024-01-16: 15 [IU] via SUBCUTANEOUS
  Filled 2024-01-16 (×2): qty 0.15

## 2024-01-16 MED ORDER — BLOOD GLUCOSE MONITOR SYSTEM W/DEVICE KIT
1.0000 | PACK | 0 refills | Status: AC
  Filled 2024-01-16: qty 1, 30d supply, fill #0

## 2024-01-16 MED ORDER — METFORMIN HCL ER 500 MG PO TB24
500.0000 mg | ORAL_TABLET | Freq: Two times a day (BID) | ORAL | 1 refills | Status: AC
  Filled 2024-01-16: qty 180, 90d supply, fill #0

## 2024-01-16 MED ORDER — AMLODIPINE BESYLATE 10 MG PO TABS
10.0000 mg | ORAL_TABLET | Freq: Every day | ORAL | 1 refills | Status: AC
  Filled 2024-01-16: qty 90, 90d supply, fill #0

## 2024-01-16 MED ORDER — INSULIN GLARGINE 100 UNIT/ML SOLOSTAR PEN
35.0000 [IU] | PEN_INJECTOR | Freq: Every day | SUBCUTANEOUS | 1 refills | Status: AC
  Filled 2024-01-16: qty 30, 85d supply, fill #0

## 2024-01-16 MED ORDER — INSULIN STARTER KIT- PEN NEEDLES (ENGLISH)
1.0000 | Freq: Once | Status: AC
  Administered 2024-01-16: 1
  Filled 2024-01-16 (×2): qty 1

## 2024-01-16 MED ORDER — ACCU-CHEK SOFTCLIX LANCETS MISC
1.0000 | 0 refills | Status: AC
  Filled 2024-01-16: qty 100, 25d supply, fill #0

## 2024-01-16 MED ORDER — INSULIN ASPART 100 UNIT/ML IJ SOLN
0.0000 [IU] | Freq: Three times a day (TID) | INTRAMUSCULAR | Status: DC
  Administered 2024-01-16 (×2): 8 [IU] via SUBCUTANEOUS
  Filled 2024-01-16 (×2): qty 8

## 2024-01-16 NOTE — Discharge Summary (Signed)
 Lawrence Knight FMW:[id number] DOB: [birth year] DOA: 01/14/2024  PCP: Pcp, No  Admit date: 01/14/2024 Discharge date: 01/16/2024  Time spent: 35 minutes  Recommendations for Outpatient Follow-up:  Establish with a pcp Referred to bariatric surgery     Discharge Diagnoses:  Principal Problem:   Diabetic ketoacidosis without coma associated with type 2 diabetes mellitus (HCC) Active Problems:   Acute dyspnea   Hypertensive urgency   Obesity, Class III, BMI 40-49.9 (morbid obesity) (HCC)   Discharge Condition: stable  Diet recommendation: carb modified  Filed Weights   01/14/24 1805  Weight: (!) 232.2 kg    History of present illness:  From admission h and p Lawrence Knight is a 24 y.o. male with medical history significant for Morbid obesity, BMI 69, prediabetes, not on any meds, being admitted with new onset diabetes with DKA.  He presented with 2 days of shortness of breath , weakness and feeling unwell, polydipsia and polyuria He denies cough, fever or chills, abdominal pain, vomiting or change in bowel habits.  Denies lower extremity pain or swelling.  Non-smoker In the ED, tachycardic and tachypneic with elevated BP up to 170/123.  Afebrile Labs notable for glucose 512 with anion gap 29.  Bicarb under 9.  VBG with pH 7.28.  Beta hydroxybutyric acid greater than 4.5.  UA with ketones only.  CBC unremarkable.EKG showed sinus tachycardia at 133.  Chest x-ray clear. Patient was treated with fluid bolus started on IV insulin   Hospital Course:   DM diagnosed last year, didn't address it. Presents with DKA, A1c in the 11s. No clear infectious trigger. Treated with IV insulin  and fluids. DKA resolved, now feeling back to baseline. Met with DM educator. Will discharge on lantus  and metformin  with instructions to titrate lantus  to initial goal of fastings 140-180. PCP resources provided. Good candidate for bariatric surgery - referral placed. GLP-1 would also be a great idea. Also found to  have hypertension - amlodipine started.   Procedures: none   Consultations: none  Discharge Exam: Vitals:   01/16/24 0735 01/16/24 0809  BP:  (!) 154/101  Pulse:  (!) 107  Resp:  18  Temp: 98.2 F (36.8 C) (!) 97.4 F (36.3 C)  SpO2:  95%    General: NAD Cardiovascular: RRR Respiratory: CTAB Abdomen: soft, non-tender  Discharge Instructions   Discharge Instructions     Amb Referral to Bariatric Surgery   Complete by: As directed    Ambulatory referral to Nutrition and Diabetic Education   Complete by: As directed    New to Insulin .  Current A1c= 11.9%.  Never established with PCP after initial diabetes diagnosis last year Nov 2024.  Needs extensive DM edu.  Thanks!   Diet Carb Modified   Complete by: As directed    Increase activity slowly   Complete by: As directed       Allergies as of 01/16/2024   No Known Allergies      Medication List     TAKE these medications    Accu-Chek Softclix Lancets lancets Use up to four times daily as directed.   amLODipine 10 MG tablet Commonly known as: NORVASC Take 1 tablet (10 mg total) by mouth daily. Start taking on: January 17, 2024   Blood Glucose Monitor System w/Device Kit Use up to four times daily as directed.   BLOOD GLUCOSE TEST STRIPS Strp Use up to four times daily as directed.   insulin  glargine 100 UNIT/ML Solostar Pen Commonly known as: LANTUS  Inject 35 Units into  the skin at bedtime.   Lancet Device Misc 1 each by Does not apply route as directed. Dispense based on patient and insurance preference. Use up to four times daily as directed. (FOR ICD-10 E10.9, E11.9).   metFORMIN  500 MG 24 hr tablet Commonly known as: GLUCOPHAGE -XR Take 1 tablet (500 mg total) by mouth 2 (two) times daily with a meal.   Pen Needles 31G X 5 MM Misc Use up to four times daily as directed.       Allergies[1]  Follow-up Information     Center, [facility], NP Follow up.   Why: this is a local  primary care clinic Contact information: [address] [place] [address] [phone]         Center, [facility] Follow up.   Why: this is another local primary care clinic Contact information: [address] [place] [address] [phone]                  The results of significant diagnostics from this hospitalization (including imaging, microbiology, ancillary and laboratory) are listed below for reference.    Significant Diagnostic Studies: DG Chest Portable 1 View Result Date: 01/14/2024 EXAM: 1 VIEW(S) XRAY OF THE CHEST 01/14/2024 07:11:03 PM COMPARISON: 12/03/2022 CLINICAL HISTORY: SOB FINDINGS: LUNGS AND PLEURA: Low lung volumes. No focal pulmonary opacity. No pleural effusion. No pneumothorax. HEART AND MEDIASTINUM: No acute abnormality of the cardiac and mediastinal silhouettes. BONES AND SOFT TISSUES: No acute osseous abnormality. IMPRESSION: 1. No acute findings. Electronically signed by: Greig Pique MD 01/14/2024 08:12 PM EST RP Workstation: [id number]    Microbiology: Recent Results (from the past 240 hours)  Culture, blood (routine x 2)     Status: None (Preliminary result)   Collection Time: 01/14/24  9:32 PM   Specimen: BLOOD  Result Value Ref Range Status   Specimen Description BLOOD RIGHT ANTECUBITAL  Final   Special Requests   Final    BOTTLES DRAWN AEROBIC AND ANAEROBIC Blood Culture adequate volume   Culture   Final    NO GROWTH 2 DAYS Performed at [facility], [address] [place], [place] [address]    Report Status PENDING  Incomplete     Labs: Basic Metabolic Panel: Recent Labs  Lab 01/14/24 2132 01/15/24 0158 01/15/24 0611 01/15/24 1400 01/16/24 0115 01/16/24 0451 01/16/24 1003  NA 142   < > 145 140 145 145 142  K 5.2*   < > 3.7 3.5 3.5 4.0 4.7  CL 104   < > 110 111 115* 114* 110  CO2 9*   < > 14* 15* 21* 22 20*  GLUCOSE 512*   < > 235* 488* 160* 159* 325*  BUN 15   < > 12 9 8 8  8   CREATININE 1.21   < > 1.16 0.95 0.86 0.91 0.89  CALCIUM 10.1   < > 10.0 9.1 9.6 9.6 9.6  MG 2.3  --   --   --   --   --   --    < > = values in this interval not displayed.   Liver Function Tests: Recent Labs  Lab 01/14/24 2132  AST 21  ALT 77*  ALKPHOS 130*  BILITOT 0.9  PROT 8.1  ALBUMIN 4.4   No results for input(s): LIPASE, AMYLASE in the last 168 hours. No results for input(s): AMMONIA in the last 168 hours. CBC: Recent Labs  Lab 01/14/24 1810  WBC 10.3  HGB 15.9  HCT 46.8  MCV 79.1*  PLT 295   Cardiac Enzymes: No results for input(s): CKTOTAL, CKMB, CKMBINDEX, TROPONINI in the last 168 hours. BNP: BNP (last 3 results) No results for input(s): BNP in the last 8760 hours.  ProBNP (last 3 results) No results for input(s): PROBNP in the last 8760 hours.  CBG: Recent Labs  Lab 01/16/24 0211 01/16/24 0340 01/16/24 0503 01/16/24 0732 01/16/24 1142  GLUCAP 154* 154* 168* 251* 300*       Signed:  Devaughn KATHEE Ban MD.  [facility] 01/16/2024, 2:20 PM     [1] No Known Allergies

## 2024-01-16 NOTE — Discharge Instructions (Signed)
 Check a fasting sugar every morning. Increase your evening insulin  by one unit every evening until your fasting sugars are consistently 140-180.

## 2024-01-16 NOTE — Progress Notes (Signed)
" ° °  Brief Progress Note   _____________________________________________________________________________________________________________  Patient Name: Lawrence Knight Patient DOB: [birth year] Date: 01/16/2024      Data: Recv'd secure chat from [place] RN regarding pt needing assistance with trying to get established with PCP.    Action: Reviewed Amion for TOC.    Response:  Jeanna RN and Building Surveyor added to secure chat for assistance.  _____________________________________________________________________________________________________________  [employer] RN Expeditor Vincente Asbridge Please contact us  directly via secure chat (search for [facility]) or by calling us  at [phone] [facility]).  "

## 2024-01-16 NOTE — Inpatient Diabetes Management (Addendum)
 Inpatient Diabetes Program Recommendations  AACE/ADA: New Consensus Statement on Inpatient Glycemic Control (2015)  Target Ranges:  Prepandial:   less than 140 mg/dL      Peak postprandial:   less than 180 mg/dL (1-2 hours)      Critically ill patients:  140 - 180 mg/dL    Latest Reference Range & Units 01/14/24 21:32  Sodium 135 - 145 mmol/L 142  Potassium 3.5 - 5.1 mmol/L 5.2 (H)  Chloride 98 - 111 mmol/L 104  CO2 22 - 32 mmol/L 9 (L)  Glucose 70 - 99 mg/dL 487 (HH)  BUN 6 - 20 mg/dL 15  Creatinine 9.38 - 8.75 mg/dL 8.78  Calcium 8.9 - 89.6 mg/dL 89.8  Anion gap 5 - 15  29 (H)    Latest Reference Range & Units 01/15/24 01:58  Hemoglobin A1C 4.8 - 5.6 % 11.9 (H)  294 mg/dl  (H): Data is abnormally high  Latest Reference Range & Units 01/15/24 23:05 01/16/24 00:07 01/16/24 01:12 01/16/24 02:11 01/16/24 03:40 01/16/24 05:03  Glucose-Capillary 70 - 99 mg/dL 830 (H)  IV Insulin  Drip Running 188 (H) 167 (H) 154 (H)  15 units Lantus  @0256  154 (H) 168 (H)  IV Insulin  Drip Stopped @0545   (H): Data is abnormally high   Admit with: DKA/ New Diagnosis of Diabetes Dx dm last year, patient did not address this Doesn't have a pcp, doesn't see a doctor Here with several days polydipsia and polyurea Morbid Obesity= BMI 69  Home DM Meds: None  Current Orders: Lantus  15 units Q24H     Novolog  Moderate Correction Scale/ SSI (0-15 units) TID AC + HS    Transitioned to SQ Insulin  this AM This Diabetes RN educated pt on diabetes back in November 2024 when pt was first diagnosed with diabetes Never established with PCP Discharged 12/10/2022 with Rx for  Metformin  500 mg BID Has Medicaid Plan to see pt today   Addendum 11:30am--Met w/ pt at bedside.  Pt told me he stopped taking the Metformin  a few months after d/c.  Did not give a reason why.  States he has a CBG meter at home but never checks.  Never followed up and made new pt PCP appt after d/c last November.    Spoke with pt  about diabetes basic care.  Discussed A1C results with him and explained what an A1C is, basic pathophysiology of DM Type 2, basic home care, basic diabetes diet nutrition principles, importance of checking CBGs and maintaining good CBG control to prevent long-term and short-term complications.  Reviewed signs and symptoms of hyperglycemia and hypoglycemia and how to treat hypoglycemia at home.  Also reviewed blood sugar goals and A1c goals for home.    RNs to provide ongoing basic DM education at bedside with this patient.  Have ordered educational booklet, insulin  starter kit.  Educated patient on insulin  pen use at home.  Reviewed all steps of insulin  pen including attachment of needle, 2-unit air shot, dialing up dose, giving injection, rotation of injection sites, removing needle, disposal of sharps, storage of unused insulin , disposal of insulin  etc.  Patient able to provide successful return demonstration.  Reviewed troubleshooting with insulin  pen.  Educated pt on what Lantus  insulin  is, how it works, importance of timing, etc.  Also reminded pt about the side effects of Metformin  and that we will have him restart Metformin  at home.  Pt will need CBG meter supplies as he stated to me he does not have strips or lancets.  Also discussed DM diet information with patient.  Encouraged patient to avoid beverages with sugar (regular soda, sweet tea, lemonade, fruit juice) and to consume mostly water.  Discussed what foods contain carbohydrates and how carbohydrates affect the body's blood sugar levels.  Encouraged patient to be careful with his portion sizes (especially grains, starchy vegetables, and fruits).  Encouraged higher protein and lower carbohydrate diet.    Reached out to [facility] team to see if they can make pt a new pt appt with PCP prior to d/c.       --Will follow patient during hospitalization--  Adina Rudolpho Arrow RN, MSN, CDCES Diabetes Coordinator Inpatient Glycemic Control  Team Team Pager: [phone] (8a-5p)

## 2024-01-16 NOTE — Telephone Encounter (Signed)
 Pharmacy Patient Advocate Encounter  Insurance verification completed.    The patient is [place] [facility] [place].     Ran test claim for Lantus  100unit Pen and the current 30 day co-pay is $4.  Ran test claim for Humalog 100unit Pen and the current 30 day co-pay is $4.  Ran test claim for Generic Novolog  100unit Pen and the current 30 day co-pay is $4.  This test claim was processed through [facility]- copay amounts may vary at other pharmacies due to [employer], or as the patient moves through the different stages of their insurance plan.

## 2024-01-19 LAB — CULTURE, BLOOD (ROUTINE X 2)
Culture: NO GROWTH
Special Requests: ADEQUATE
